# Patient Record
Sex: Male | Born: 1945 | Race: White | Hispanic: No | Marital: Married | Smoking: Current every day smoker
Health system: Southern US, Community
[De-identification: ages and names within clinical notes are randomized; demographics above are authoritative.]

## PROBLEM LIST (undated history)

## (undated) DIAGNOSIS — J449 Chronic obstructive pulmonary disease, unspecified: Secondary | ICD-10-CM

## (undated) DIAGNOSIS — F329 Major depressive disorder, single episode, unspecified: Secondary | ICD-10-CM

## (undated) DIAGNOSIS — F32A Depression, unspecified: Secondary | ICD-10-CM

## (undated) DIAGNOSIS — I10 Essential (primary) hypertension: Secondary | ICD-10-CM

## (undated) DIAGNOSIS — I4891 Unspecified atrial fibrillation: Secondary | ICD-10-CM

## (undated) DIAGNOSIS — F419 Anxiety disorder, unspecified: Secondary | ICD-10-CM

## (undated) HISTORY — PX: TONSILECTOMY/ADENOIDECTOMY WITH MYRINGOTOMY: SHX6125

---

## 2014-12-18 ENCOUNTER — Inpatient Hospital Stay (HOSPITAL_COMMUNITY)
Admission: AD | Admit: 2014-12-18 | Discharge: 2015-01-07 | DRG: 166 | Disposition: A | Discharge status: Hospice | Payer: Medicare Other | Source: Other Acute Inpatient Hospital | Attending: Cardiothoracic Surgery | Admitting: Cardiothoracic Surgery

## 2014-12-18 ENCOUNTER — Inpatient Hospital Stay (HOSPITAL_COMMUNITY): Payer: Medicare Other

## 2014-12-18 DIAGNOSIS — E43 Unspecified severe protein-calorie malnutrition: Secondary | ICD-10-CM | POA: Insufficient documentation

## 2014-12-18 DIAGNOSIS — F329 Major depressive disorder, single episode, unspecified: Secondary | ICD-10-CM | POA: Diagnosis present

## 2014-12-18 DIAGNOSIS — J9311 Primary spontaneous pneumothorax: Secondary | ICD-10-CM | POA: Diagnosis not present

## 2014-12-18 DIAGNOSIS — Z880 Allergy status to penicillin: Secondary | ICD-10-CM

## 2014-12-18 DIAGNOSIS — R319 Hematuria, unspecified: Secondary | ICD-10-CM | POA: Diagnosis present

## 2014-12-18 DIAGNOSIS — J860 Pyothorax with fistula: Secondary | ICD-10-CM | POA: Diagnosis not present

## 2014-12-18 DIAGNOSIS — T8182XA Emphysema (subcutaneous) resulting from a procedure, initial encounter: Secondary | ICD-10-CM | POA: Diagnosis present

## 2014-12-18 DIAGNOSIS — F17210 Nicotine dependence, cigarettes, uncomplicated: Secondary | ICD-10-CM | POA: Diagnosis present

## 2014-12-18 DIAGNOSIS — F419 Anxiety disorder, unspecified: Secondary | ICD-10-CM | POA: Diagnosis present

## 2014-12-18 DIAGNOSIS — J029 Acute pharyngitis, unspecified: Secondary | ICD-10-CM | POA: Diagnosis not present

## 2014-12-18 DIAGNOSIS — J439 Emphysema, unspecified: Secondary | ICD-10-CM

## 2014-12-18 DIAGNOSIS — I9581 Postprocedural hypotension: Secondary | ICD-10-CM | POA: Diagnosis not present

## 2014-12-18 DIAGNOSIS — N281 Cyst of kidney, acquired: Secondary | ICD-10-CM | POA: Diagnosis present

## 2014-12-18 DIAGNOSIS — J9622 Acute and chronic respiratory failure with hypercapnia: Secondary | ICD-10-CM | POA: Diagnosis not present

## 2014-12-18 DIAGNOSIS — J939 Pneumothorax, unspecified: Secondary | ICD-10-CM | POA: Diagnosis not present

## 2014-12-18 DIAGNOSIS — Y828 Other medical devices associated with adverse incidents: Secondary | ICD-10-CM | POA: Diagnosis present

## 2014-12-18 DIAGNOSIS — R0602 Shortness of breath: Secondary | ICD-10-CM | POA: Diagnosis present

## 2014-12-18 DIAGNOSIS — Z6823 Body mass index (BMI) 23.0-23.9, adult: Secondary | ICD-10-CM | POA: Diagnosis not present

## 2014-12-18 DIAGNOSIS — R739 Hyperglycemia, unspecified: Secondary | ICD-10-CM | POA: Diagnosis present

## 2014-12-18 DIAGNOSIS — J449 Chronic obstructive pulmonary disease, unspecified: Secondary | ICD-10-CM

## 2014-12-18 DIAGNOSIS — E46 Unspecified protein-calorie malnutrition: Secondary | ICD-10-CM

## 2014-12-18 DIAGNOSIS — I10 Essential (primary) hypertension: Secondary | ICD-10-CM | POA: Diagnosis present

## 2014-12-18 DIAGNOSIS — J441 Chronic obstructive pulmonary disease with (acute) exacerbation: Secondary | ICD-10-CM | POA: Diagnosis present

## 2014-12-18 DIAGNOSIS — I4891 Unspecified atrial fibrillation: Secondary | ICD-10-CM | POA: Diagnosis present

## 2014-12-18 DIAGNOSIS — I700 Atherosclerosis of aorta: Secondary | ICD-10-CM | POA: Diagnosis present

## 2014-12-18 DIAGNOSIS — E785 Hyperlipidemia, unspecified: Secondary | ICD-10-CM | POA: Diagnosis present

## 2014-12-18 DIAGNOSIS — Z7951 Long term (current) use of inhaled steroids: Secondary | ICD-10-CM

## 2014-12-18 DIAGNOSIS — Z9981 Dependence on supplemental oxygen: Secondary | ICD-10-CM

## 2014-12-18 DIAGNOSIS — J9383 Other pneumothorax: Secondary | ICD-10-CM | POA: Diagnosis present

## 2014-12-18 DIAGNOSIS — Z7982 Long term (current) use of aspirin: Secondary | ICD-10-CM

## 2014-12-18 DIAGNOSIS — J9621 Acute and chronic respiratory failure with hypoxia: Secondary | ICD-10-CM | POA: Diagnosis not present

## 2014-12-18 DIAGNOSIS — J9601 Acute respiratory failure with hypoxia: Secondary | ICD-10-CM

## 2014-12-18 DIAGNOSIS — I252 Old myocardial infarction: Secondary | ICD-10-CM

## 2014-12-18 DIAGNOSIS — Z419 Encounter for procedure for purposes other than remedying health state, unspecified: Secondary | ICD-10-CM

## 2014-12-18 DIAGNOSIS — J9382 Other air leak: Secondary | ICD-10-CM | POA: Diagnosis present

## 2014-12-18 DIAGNOSIS — Z9689 Presence of other specified functional implants: Secondary | ICD-10-CM

## 2014-12-18 DIAGNOSIS — Z01810 Encounter for preprocedural cardiovascular examination: Secondary | ICD-10-CM | POA: Diagnosis not present

## 2014-12-18 HISTORY — DX: Chronic obstructive pulmonary disease, unspecified: J44.9

## 2014-12-18 HISTORY — DX: Essential (primary) hypertension: I10

## 2014-12-18 HISTORY — DX: Unspecified atrial fibrillation: I48.91

## 2014-12-18 HISTORY — DX: Major depressive disorder, single episode, unspecified: F32.9

## 2014-12-18 HISTORY — DX: Anxiety disorder, unspecified: F41.9

## 2014-12-18 HISTORY — DX: Depression, unspecified: F32.A

## 2014-12-19 ENCOUNTER — Encounter (HOSPITAL_COMMUNITY): Payer: Self-pay | Admitting: Internal Medicine

## 2014-12-19 DIAGNOSIS — J939 Pneumothorax, unspecified: Secondary | ICD-10-CM

## 2014-12-19 LAB — COMPREHENSIVE METABOLIC PANEL
ALT: 17 U/L (ref 0–53)
ANION GAP: 3 — AB (ref 5–15)
AST: 21 U/L (ref 0–37)
Albumin: 3.2 g/dL — ABNORMAL LOW (ref 3.5–5.2)
Alkaline Phosphatase: 69 U/L (ref 39–117)
BUN: 15 mg/dL (ref 6–23)
CO2: 38 mmol/L — ABNORMAL HIGH (ref 19–32)
Calcium: 8.6 mg/dL (ref 8.4–10.5)
Chloride: 98 mmol/L (ref 96–112)
Creatinine, Ser: 0.56 mg/dL (ref 0.50–1.35)
GFR calc Af Amer: >90 mL/min (ref >90)
Glucose, Bld: 140 mg/dL — ABNORMAL HIGH (ref 70–99)
Potassium: 4.4 mmol/L (ref 3.5–5.1)
Sodium: 139 mmol/L (ref 135–145)
Total Bilirubin: 0.3 mg/dL (ref 0.3–1.2)
Total Protein: 6.3 g/dL (ref 6.0–8.3)

## 2014-12-19 LAB — MAGNESIUM: Magnesium: 2.1 mg/dL (ref 1.5–2.5)

## 2014-12-19 LAB — CBC WITH DIFFERENTIAL/PLATELET
Basophils Absolute: 0 10*3/uL (ref 0.0–0.1)
Basophils Relative: 0 % (ref 0–1)
Eosinophils Absolute: 0 10*3/uL (ref 0.0–0.7)
Eosinophils Relative: 0 % (ref 0–5)
HCT: 37.9 % — ABNORMAL LOW (ref 39.0–52.0)
Hemoglobin: 11.3 g/dL — ABNORMAL LOW (ref 13.0–17.0)
LYMPHS PCT: 7 % — AB (ref 12–46)
Lymphs Abs: 1 10*3/uL (ref 0.7–4.0)
MCH: 29.1 pg (ref 26.0–34.0)
MCHC: 29.8 g/dL — ABNORMAL LOW (ref 30.0–36.0)
MCV: 97.7 fL (ref 78.0–100.0)
Monocytes Absolute: 0.9 10*3/uL (ref 0.1–1.0)
Monocytes Relative: 7 % (ref 3–12)
Neutro Abs: 11.4 10*3/uL — ABNORMAL HIGH (ref 1.7–7.7)
Neutrophils Relative %: 86 % — ABNORMAL HIGH (ref 43–77)
PLATELETS: 157 10*3/uL (ref 150–400)
RBC: 3.88 MIL/uL — ABNORMAL LOW (ref 4.22–5.81)
RDW: 15.1 % (ref 11.5–15.5)
WBC: 13.2 10*3/uL — ABNORMAL HIGH (ref 4.0–10.5)

## 2014-12-19 LAB — GLUCOSE, CAPILLARY
GLUCOSE-CAPILLARY: 129 mg/dL — AB (ref 70–99)
Glucose-Capillary: 113 mg/dL — ABNORMAL HIGH (ref 70–99)
Glucose-Capillary: 104 mg/dL — ABNORMAL HIGH (ref 70–99)
Glucose-Capillary: 120 mg/dL — ABNORMAL HIGH (ref 70–99)
Glucose-Capillary: 138 mg/dL — ABNORMAL HIGH (ref 70–99)

## 2014-12-19 LAB — PHOSPHORUS: PHOSPHORUS: 2.8 mg/dL (ref 2.3–4.6)

## 2014-12-19 LAB — MRSA PCR SCREENING: MRSA BY PCR: NEGATIVE

## 2014-12-19 MED ORDER — IPRATROPIUM-ALBUTEROL 0.5-2.5 (3) MG/3ML IN SOLN
3.0000 mL | Freq: Four times a day (QID) | RESPIRATORY_TRACT | Status: DC | PRN
  Administered 2014-12-19 – 2014-12-31 (×8): 3 mL via RESPIRATORY_TRACT
  Filled 2014-12-19 (×8): qty 3

## 2014-12-19 MED ORDER — HEPARIN SODIUM (PORCINE) 5000 UNIT/ML IJ SOLN
5000.0000 [IU] | Freq: Three times a day (TID) | INTRAMUSCULAR | Status: DC
  Administered 2014-12-19 – 2014-12-30 (×34): 5000 [IU] via SUBCUTANEOUS
  Filled 2014-12-19 (×45): qty 1

## 2014-12-19 MED ORDER — INSULIN ASPART 100 UNIT/ML ~~LOC~~ SOLN
1.0000 [IU] | SUBCUTANEOUS | Status: DC
  Administered 2014-12-19 – 2014-12-21 (×5): 1 [IU] via SUBCUTANEOUS

## 2014-12-19 MED ORDER — BUSPIRONE HCL 10 MG PO TABS
10.0000 mg | ORAL_TABLET | Freq: Three times a day (TID) | ORAL | Status: DC
  Administered 2014-12-19 – 2015-01-07 (×57): 10 mg via ORAL
  Filled 2014-12-19 (×62): qty 1

## 2014-12-19 MED ORDER — ASPIRIN 81 MG PO CHEW
81.0000 mg | CHEWABLE_TABLET | Freq: Every day | ORAL | Status: DC
  Administered 2014-12-19 – 2015-01-07 (×19): 81 mg via ORAL
  Filled 2014-12-19 (×19): qty 1

## 2014-12-19 MED ORDER — SERTRALINE HCL 100 MG PO TABS
100.0000 mg | ORAL_TABLET | Freq: Every day | ORAL | Status: DC
  Administered 2014-12-19 – 2015-01-07 (×19): 100 mg via ORAL
  Filled 2014-12-19 (×20): qty 1

## 2014-12-19 MED ORDER — TIOTROPIUM BROMIDE MONOHYDRATE 18 MCG IN CAPS
18.0000 ug | ORAL_CAPSULE | Freq: Every day | RESPIRATORY_TRACT | Status: DC
  Administered 2014-12-19 – 2015-01-07 (×16): 18 ug via RESPIRATORY_TRACT
  Filled 2014-12-19 (×5): qty 5

## 2014-12-19 MED ORDER — NICOTINE 14 MG/24HR TD PT24
14.0000 mg | MEDICATED_PATCH | Freq: Every day | TRANSDERMAL | Status: DC
  Administered 2014-12-19: 14 mg via TRANSDERMAL
  Filled 2014-12-19 (×20): qty 1

## 2014-12-19 MED ORDER — ADULT MULTIVITAMIN W/MINERALS CH
1.0000 | ORAL_TABLET | Freq: Every day | ORAL | Status: DC
  Administered 2014-12-19 – 2015-01-07 (×19): 1 via ORAL
  Filled 2014-12-19 (×20): qty 1

## 2014-12-19 MED ORDER — MIRTAZAPINE 7.5 MG PO TABS
7.5000 mg | ORAL_TABLET | Freq: Every day | ORAL | Status: DC
  Administered 2014-12-19 – 2015-01-06 (×20): 7.5 mg via ORAL
  Filled 2014-12-19 (×24): qty 1

## 2014-12-19 MED ORDER — METOPROLOL TARTRATE 12.5 MG HALF TABLET
12.5000 mg | ORAL_TABLET | Freq: Two times a day (BID) | ORAL | Status: DC
  Administered 2014-12-19 – 2015-01-07 (×36): 12.5 mg via ORAL
  Filled 2014-12-19 (×41): qty 1

## 2014-12-19 MED ORDER — ACETAMINOPHEN 325 MG PO TABS
650.0000 mg | ORAL_TABLET | Freq: Four times a day (QID) | ORAL | Status: DC | PRN
  Administered 2014-12-19 – 2014-12-29 (×3): 650 mg via ORAL
  Filled 2014-12-19 (×3): qty 2

## 2014-12-19 MED ORDER — BUDESONIDE-FORMOTEROL FUMARATE 160-4.5 MCG/ACT IN AERO
2.0000 | INHALATION_SPRAY | Freq: Two times a day (BID) | RESPIRATORY_TRACT | Status: DC
  Administered 2014-12-19 – 2014-12-30 (×23): 2 via RESPIRATORY_TRACT
  Filled 2014-12-19 (×2): qty 6

## 2014-12-19 MED ORDER — SODIUM CHLORIDE 0.9 % IV SOLN: 250.0000 mL | INTRAVENOUS | Status: DC | PRN

## 2014-12-19 MED ORDER — PRAVASTATIN SODIUM 80 MG PO TABS
80.0000 mg | ORAL_TABLET | Freq: Every day | ORAL | Status: DC
Start: 2014-12-19 — End: 2015-01-07
  Administered 2014-12-19 – 2015-01-07 (×19): 80 mg via ORAL
  Filled 2014-12-19 (×20): qty 1

## 2014-12-19 NOTE — Progress Notes (Signed)
12/19/2014 1820 Received to room 2w02 a tx from 72M.  Monitor placed and CT called.  Oriented pt. To room, call light and bed.  Call bell in reach.  Chest tube collection system secure on floor.   Kathryne HitchAllen, Perel Hauschild C

## 2014-12-19 NOTE — Progress Notes (Signed)
PULMONARY / CRITICAL CARE MEDICINE HISTORY AND PHYSICAL EXAMINATION   Name: Roger Mathews MRN: [id number] DOB: 12-03-1945    ADMISSION DATE:  12/18/2014  PRIMARY SERVICE: PCCM  CHIEF COMPLAINT:  SOB  BRIEF PATIENT DESCRIPTION: 62 M with "end stage" COPD admitted to [facility] with SOB found to have a large pneumothorax. Chest tube placed and transferred to [facility] for further management.   >was on home hospice prior   SIGNIFICANT EVENTS / STUDIES:  Chest tube placed 2/20 ABGs 7.14/104/83 --> 7.18/92.2/178  LINES / TUBES: PIV  CULTURES: None  ANTIBIOTICS: Doxy 2/20 -  SUBJECTIVE:  No distress.   VITAL SIGNS: Temp:  [98.4 F (36.9 C)-98.5 F (36.9 C)] 98.4 F (36.9 C) (02/21 0440) Pulse Rate:  [82-116] 85 (02/21 0600) Resp:  [21-26] 21 (02/21 0600) BP: (82-118)/(63-93) 104/68 mmHg (02/21 0600) SpO2:  [92 %-97 %] 97 % (02/21 0600) Weight:  [73.5 kg (162 lb 0.6 oz)-75.4 kg (166 lb 3.6 oz)] 73.5 kg (162 lb 0.6 oz) (02/21 0600)    INTAKE / OUTPUT: Intake/Output      02/20 0701 - 02/21 0700 02/21 0701 - 02/22 0700   P.O. 720    Total Intake(mL/kg) 720 (9.8)    Urine (mL/kg/hr) 900    Total Output 900     Net -180            PHYSICAL EXAMINATION: General:  Elderly-appearing M in NAD Neuro:  Intact HEENT:  Sclera anicteric, conjunctiva pink, MMM, OP clear Neck: Trachea supple and midline, (-) LAN or JVD Cardiovascular:  RRR, NS1/S2, (-) MRG Lungs:  Mild wheezing on the left, decreased BS throughout the right. (+) subcutaneous air throughout the right hemithorax + intermittent 5/5 airleak  Abdomen:  S/NT/ND/(+)BS Musculoskeletal:  (-) C/C/E Skin:  Intact  LABS:  CBC  Recent Labs Lab 12/19/14 0302  WBC 13.2*  HGB 11.3*  HCT 37.9*  PLT 157   Coag's No results for input(s): APTT, INR in the last 168 hours. BMET  Recent Labs Lab 12/19/14 0302  NA 139  K 4.4  CL 98  CO2 38*  BUN 15  CREATININE 0.56  GLUCOSE 140*   Electrolytes  Recent  Labs Lab 12/19/14 0302  CALCIUM 8.6  MG 2.1  PHOS 2.8   Sepsis Markers No results for input(s): LATICACIDVEN, PROCALCITON, O2SATVEN in the last 168 hours. ABG No results for input(s): PHART, PCO2ART, PO2ART in the last 168 hours. Liver Enzymes  Recent Labs Lab 12/19/14 0302  AST 21  ALT 17  ALKPHOS 69  BILITOT 0.3  ALBUMIN 3.2*   Cardiac Enzymes No results for input(s): TROPONINI, PROBNP in the last 168 hours. Glucose  Recent Labs Lab 12/18/14 2347 12/19/14 0605  GLUCAP 129* 113*   OSH lab work reviewed. Glucose 291, WBC 15.1   Imaging Dg Chest Port 1 View  12/19/2014   CLINICAL DATA:  Right-sided chest tube  EXAM: PORTABLE CHEST - 1 VIEW  COMPARISON:  None.  FINDINGS: There is a right chest tube. There is extensive subcutaneous emphysema. The subcutaneous emphysema decreases sensitivity for detection of a pneumothorax. There appears to be a small apical pleural pneumothorax on the right. No consolidated airspace opacities are evident. No large effusions are evident.  IMPRESSION: Probable small apical pneumothorax on the right. Right chest tube. Subcutaneous emphysema.   Electronically Signed   By: Ellery Plunk M.D.   On: 12/19/2014 00:30    EKG: Non in chart CXR: Small right PTX with [place] emphysema  ASSESSMENT /  PLAN:  Active Problems:   Pneumothorax    Pneumothorax with [place] emphysema: Patient still with air leak. On arrival no occlusive dressing in place. COPD: P:   Maintain CT to suction, keep at current pressure Continue OP Meds PRN Duonebs Supplemental O2 to maintain sats >= 92% F/u CXR in am  Afib:  P:   Cont BB for now but overall poor choice for patient with "end stage COPD"  Hematuria: Did not have a Foley placed in hospital. P:   Consider urology consult for scope if continues    TODAY'S SUMMARY:  spont PTX. No distress. Still w/ intermittent airleak. Should be good for SDU transfer later this afternoon  Simonne MartinetPeter E Babcock  ACNP-BC [facility] Pager # [phone] OR # [phone] if no answer  12/19/2014, 7:22 AM  STAFF NOTE: I, Rory Percyaniel Aaditya Letizia, MD FACP have personally reviewed patient's available data, including medical history, events of note, physical examination and test results as part of my evaluation. I have discussed with resident/NP and other care providers such as pharmacist, RN and RRT. In addition, I personally evaluated patient and elicited key findings of: no distress, appears had a substantial PTX pre CT, leak noted, keep to suction, pcxr in am , move out of icu, BDer's, on diet  Mcarthur RossettiDaniel J. Tyson AliasFeinstein, MD, FACP Pgr: [phone] [place] [facility] 12/19/2014 8:55 AM

## 2014-12-19 NOTE — H&P (Signed)
PULMONARY / CRITICAL CARE MEDICINE HISTORY AND PHYSICAL EXAMINATION   Name: Roger Mathews MRN: [id number] DOB: [birth year]    ADMISSION DATE:  12/18/2014  PRIMARY SERVICE: PCCM  CHIEF COMPLAINT:  SOB  BRIEF PATIENT DESCRIPTION: 69 M with "end stage" COPD admitted to [facility] with SOB found to have a large pneumothorax. Chest tube placed and transferred to [facility] for further management.   SIGNIFICANT EVENTS / STUDIES:  Chest tube placed 2/20 ABGs 7.14/104/83 --> 7.18/92.2/178  LINES / TUBES: PIV  CULTURES: None  ANTIBIOTICS: Doxy 2/20 -  HISTORY OF PRESENT ILLNESS:  Roger Mathews is a 69 yo M with  "end stage" COPD who was on hospice until the morning of 2/20. The evening prior to presentation he noted worsening SOB; it became intolerable on the morning of presentation and he decided to go the local ED. At the local ED he was found to have a pneumothorax and a chest tube was placed. He remained hypercarbic and was placed on BiPAP; he was transferred to [facility] in anticipation of intubation. Prior to the transfer, he developed subcutaneous emphysema. Currently, he feels much better than when he arrived at [facility].  PAST MEDICAL HISTORY :  Past Medical History  Diagnosis Date  . COPD (chronic obstructive pulmonary disease)   . Anxiety   . Depression   . HTN (hypertension)    Past Surgical History  Procedure Laterality Date  . Tonsilectomy/adenoidectomy with myringotomy     Prior to Admission medications   Medication Sig Start Date End Date Taking? Authorizing Provider  albuterol (PROVENTIL) (2.5 MG/3ML) 0.083% nebulizer solution Take 2.5 mg by nebulization every 6 (six) hours as needed for wheezing or shortness of breath.   Yes Historical Provider, MD  aspirin 81 MG tablet Take 81 mg by mouth daily.   Yes Historical Provider, MD  budesonide-formoterol (SYMBICORT) 160-4.5 MCG/ACT inhaler Inhale 2 puffs into the lungs 2 (two) times daily.   Yes Historical Provider, MD   busPIRone (BUSPAR) 10 MG tablet Take 10 mg by mouth 3 (three) times daily.   Yes Historical Provider, MD  hyoscyamine (ANASPAZ) 0.125 MG TBDP disintergrating tablet Place 0.25 mg under the tongue every 4 (four) hours as needed (secretions).   Yes Historical Provider, MD  metoprolol tartrate (LOPRESSOR) 25 MG tablet Take 12.5 mg by mouth 2 (two) times daily.   Yes Historical Provider, MD  mirtazapine (REMERON) 7.5 MG tablet Take 7.5 mg by mouth at bedtime.   Yes Historical Provider, MD  Multiple Vitamins-Minerals (MULTIVITAMIN WITH MINERALS) tablet Take 1 tablet by mouth daily.   Yes Historical Provider, MD  nicotine (NICODERM CQ - DOSED IN MG/24 HOURS) 14 mg/24hr patch Place 14 mg onto the skin daily.   Yes Historical Provider, MD  ondansetron (ZOFRAN) 8 MG tablet Take by mouth every 8 (eight) hours as needed for nausea or vomiting.   Yes Historical Provider, MD  pravastatin (PRAVACHOL) 80 MG tablet Take 80 mg by mouth daily.   Yes Historical Provider, MD  sertraline (ZOLOFT) 100 MG tablet Take 100 mg by mouth daily.   Yes Historical Provider, MD  tiotropium (SPIRIVA) 18 MCG inhalation capsule Place 18 mcg into inhaler and inhale daily.   Yes Historical Provider, MD   Allergies  Allergen Reactions  . Penicillins     Other reaction(s): Unknown    FAMILY HISTORY:  No family history on file. SOCIAL HISTORY:  reports that he has been smoking Cigarettes.  He started smoking about 53 years ago. He has a  104 pack-year smoking history. He does not have any smokeless tobacco history on file. His alcohol and drug histories are not on file.  REVIEW OF SYSTEMS:  A 12-system ROS was conducted and, unless otherwise specified in the HPI, was negative.   SUBJECTIVE:   VITAL SIGNS: Temp:  [98.5 F (36.9 C)] 98.5 F (36.9 C) (02/20 2351) Pulse Rate:  [104-116] 104 (02/21 0100) Resp:  [23-25] 25 (02/21 0100) BP: (82-118)/(67-93) 82/67 mmHg (02/21 0100) SpO2:  [92 %-94 %] 94 % (02/21 0100) Weight:   [166 lb 3.6 oz (75.4 kg)] 166 lb 3.6 oz (75.4 kg) (02/21 0000) HEMODYNAMICS:   VENTILATOR SETTINGS:   INTAKE / OUTPUT: Intake/Output      02/20 0701 - 02/21 0700   Urine (mL/kg/hr) 150   Total Output 150   Net -150         PHYSICAL EXAMINATION: General:  Elderly-appearing M in NAD Neuro:  Intact HEENT:  Sclera anicteric, conjunctiva pink, MMM, OP clear Neck: Trachea supple and midline, (-) LAN or JVD Cardiovascular:  RRR, NS1/S2, (-) MRG Lungs:  Mild wheezing on the left, decreased BS throughout the right. (+) subcutaneous air throughout the right hemithorax Abdomen:  S/NT/ND/(+)BS Musculoskeletal:  (-) C/C/E Skin:  Intact  LABS:  CBC No results for input(s): WBC, HGB, HCT, PLT in the last 168 hours. Coag's No results for input(s): APTT, INR in the last 168 hours. BMET No results for input(s): NA, K, CL, CO2, BUN, CREATININE, GLUCOSE in the last 168 hours. Electrolytes No results for input(s): CALCIUM, MG, PHOS in the last 168 hours. Sepsis Markers No results for input(s): LATICACIDVEN, PROCALCITON, O2SATVEN in the last 168 hours. ABG No results for input(s): PHART, PCO2ART, PO2ART in the last 168 hours. Liver Enzymes No results for input(s): AST, ALT, ALKPHOS, BILITOT, ALBUMIN in the last 168 hours. Cardiac Enzymes No results for input(s): TROPONINI, PROBNP in the last 168 hours. Glucose  Recent Labs Lab 12/18/14 2347  GLUCAP 129*   OSH lab work reviewed. Glucose 291, WBC 15.1   Imaging Dg Chest Port 1 View  12/19/2014   CLINICAL DATA:  Right-sided chest tube  EXAM: PORTABLE CHEST - 1 VIEW  COMPARISON:  None.  FINDINGS: There is a right chest tube. There is extensive subcutaneous emphysema. The subcutaneous emphysema decreases sensitivity for detection of a pneumothorax. There appears to be a small apical pleural pneumothorax on the right. No consolidated airspace opacities are evident. No large effusions are evident.  IMPRESSION: Probable small apical  pneumothorax on the right. Right chest tube. Subcutaneous emphysema.   Electronically Signed   By: Ellery Plunkaniel R Mitchell M.D.   On: 12/19/2014 00:30    EKG: Non in chart CXR: Small right PTX with [place] emphysema  ASSESSMENT / PLAN:  Active Problems:   * No active hospital problems. *   PULMONARY A: Pneumothorax with [place] emphysema: Patient still with air leak. On arrival no occlusive dressing in place. COPD: P:   Maintain CT to suction overnight Continue OP Meds PRN Duonebs Supplemental O2 to maintain sats >= 92%  CARDIOVASCULAR A: Afib:  P:   Cont BB for now but overall poor choice for patient with "end stage COPD"  RENAL A: Hematuria: Did not have a Foley placed in hospital. P:   Consider urology consult for scope  GASTROINTESTINAL A: No acute issues  HEMATOLOGIC A: No acute issues  INFECTIOUS A: No Acute Issues  ENDOCRINE A: Elevated CBG:  P:   A1c SSI  NEUROLOGIC A: No acute  issues P: Continue OP regimen  BEST PRACTICE / DISPOSITION Level of Care:  ICU overnight, likely to floor in AM Primary Service:  PCCM Consultants:  None Code Status:  Full Diet:  Regular DVT Px:  SQH GI Px:  Not indicated Skin Integrity:  Intact Social / Family:  Not updated  TODAY'S SUMMARY:   I have personally obtained a history, examined the patient, evaluated laboratory and imaging results, formulated the assessment and plan and placed orders.  CRITICAL CARE: The patient is critically ill with multiple organ systems failure and requires high complexity decision making for assessment and support, frequent evaluation and titration of therapies, application of advanced monitoring technologies and extensive interpretation of multiple databases. Critical Care Time devoted to patient care services described in this note is 35 minutes.   Evalyn Casco, MD Pulmonary and Critical Care Medicine [facility] Pager: [phone]   12/19/2014, 1:14 AM

## 2014-12-19 NOTE — Progress Notes (Signed)
Spoke with Dr Katrinka Blazingsmith with Critical care/elink and orders received to exchage chest tube drainage system to the [place] chest drain and to place chest tube at -20 cm suction. Orders were entered and completed. patient tolerated well.Johnedward Brodrick, Randall AnKristin Jessup RN

## 2014-12-19 NOTE — Progress Notes (Signed)
Documentation for chest tube/system type missing from previous hospital placement. RN assessment of chest tube insertion site was without significant findings, no draining or leaking from the insertion site, was unable to view site as it is covered in tape.  Current drainage system was to water seal drainage.  Roger Mathews

## 2014-12-20 ENCOUNTER — Inpatient Hospital Stay (HOSPITAL_COMMUNITY): Payer: Medicare Other

## 2014-12-20 DIAGNOSIS — J939 Pneumothorax, unspecified: Secondary | ICD-10-CM

## 2014-12-20 LAB — COMPREHENSIVE METABOLIC PANEL
ALT: 18 U/L (ref 0–53)
ANION GAP: 4 — AB (ref 5–15)
AST: 19 U/L (ref 0–37)
Albumin: 3.3 g/dL — ABNORMAL LOW (ref 3.5–5.2)
Alkaline Phosphatase: 71 U/L (ref 39–117)
BUN: 15 mg/dL (ref 6–23)
CHLORIDE: 97 mmol/L (ref 96–112)
CO2: 43 mmol/L — AB (ref 19–32)
Calcium: 8.9 mg/dL (ref 8.4–10.5)
Creatinine, Ser: 0.53 mg/dL (ref 0.50–1.35)
GLUCOSE: 102 mg/dL — AB (ref 70–99)
POTASSIUM: 4.3 mmol/L (ref 3.5–5.1)
Sodium: 144 mmol/L (ref 135–145)
Total Bilirubin: 0.3 mg/dL (ref 0.3–1.2)
Total Protein: 6.5 g/dL (ref 6.0–8.3)

## 2014-12-20 LAB — GLUCOSE, CAPILLARY
GLUCOSE-CAPILLARY: 140 mg/dL — AB (ref 70–99)
GLUCOSE-CAPILLARY: 99 mg/dL (ref 70–99)
Glucose-Capillary: 106 mg/dL — ABNORMAL HIGH (ref 70–99)
Glucose-Capillary: 109 mg/dL — ABNORMAL HIGH (ref 70–99)
Glucose-Capillary: 124 mg/dL — ABNORMAL HIGH (ref 70–99)
Glucose-Capillary: 59 mg/dL — ABNORMAL LOW (ref 70–99)
Glucose-Capillary: 98 mg/dL (ref 70–99)

## 2014-12-20 LAB — CBC
HCT: 41.1 % (ref 39.0–52.0)
HEMOGLOBIN: 12.2 g/dL — AB (ref 13.0–17.0)
MCH: 29.1 pg (ref 26.0–34.0)
MCHC: 29.7 g/dL — ABNORMAL LOW (ref 30.0–36.0)
MCV: 98.1 fL (ref 78.0–100.0)
PLATELETS: 159 10*3/uL (ref 150–400)
RBC: 4.19 MIL/uL — ABNORMAL LOW (ref 4.22–5.81)
RDW: 15.1 % (ref 11.5–15.5)
WBC: 9.9 10*3/uL (ref 4.0–10.5)

## 2014-12-20 LAB — HEMOGLOBIN A1C
Hgb A1c MFr Bld: 5.8 % — ABNORMAL HIGH (ref 4.8–5.6)
MEAN PLASMA GLUCOSE: 120 mg/dL

## 2014-12-20 NOTE — Progress Notes (Signed)
Lab result called:  CO2 = 43  Internal Medicine team notified.

## 2014-12-20 NOTE — Plan of Care (Signed)
Problem: Phase I Progression Outcomes Goal: O2 sats > or equal 90% or at baseline Outcome: Completed/Met Date Met:  12/20/14 Patient is currently on 3L which is his baseline at home

## 2014-12-20 NOTE — Progress Notes (Signed)
Inpatient Diabetes Program Recommendations  AACE/ADA: New Consensus Statement on Inpatient Glycemic Control (2013)  Target Ranges:  Prepandial:   less than 140 mg/dL      Peak postprandial:   less than 180 mg/dL (1-2 hours)      Critically ill patients:  140 - 180 mg/dL    Pt on ICU protocol correction scale-receiving very little correction. May want to order sensitive tidwc if needed.  Thank you Roger CoffinAnn Oluwakemi Salsberry, RN, MSN, CDE  Diabetes Inpatient Program Office: [phone] Pager: [phone]

## 2014-12-20 NOTE — Progress Notes (Signed)
CBG 138 AT BEDTIME. 1 UNIT OF NOVOLOG GIVEN PER SLIDING SCALE.  CBG AT THIS TIME 59. PATIENT ASYMPTOMATIC.  PATIENT GIVEN ICE CREAM, ORANGE JUICE, AND GRAHAM CRACKERS.  WILL RE-ASSESS.

## 2014-12-20 NOTE — Progress Notes (Signed)
PULMONARY / CRITICAL CARE MEDICINE    Name: Roger Mathews MRN: [id number] DOB: April 02, 1946    ADMISSION DATE:  12/18/2014  PRIMARY SERVICE: PCCM  CHIEF COMPLAINT:  SOB  BRIEF PATIENT DESCRIPTION: [age] M with "end stage" COPD [facility] with SOB found to have a large pneumothorax. Chest tube placed and [facility].   >was on home hospice prior   SIGNIFICANT EVENTS / STUDIES:  Chest tube placed 2/20 ABGs 7.14/104/83 --> 7.18/92.2/178   SUBJECTIVE:  No distress.   VITAL SIGNS: Temp:  [98.1 F (36.7 C)-98.7 F (37.1 C)] 98.7 F (37.1 C) (02/22 0443) Pulse Rate:  [79-119] 114 (02/22 0443) Resp:  [15-22] 22 (02/22 0443) BP: (100-120)/(59-102) 116/97 mmHg (02/22 0443) SpO2:  [91 %-99 %] 95 % (02/22 0443) Weight:  [160 lb 0.9 oz (72.6 kg)] 160 lb 0.9 oz (72.6 kg) (02/22 0443)    INTAKE / OUTPUT: Intake/Output      02/21 0701 - 02/22 0700 02/22 0701 - 02/23 0700   P.O. 420    Total Intake(mL/kg) 420 (5.8)    Urine (mL/kg/hr) 1625 (0.9)    Chest Tube 0 (0) 0 (0)   Total Output 1625 0   Net -1205 0          PHYSICAL EXAMINATION: General:  Elderly-appearing M in NAD Neuro:  Intact, MAE, appropriate  HEENT:  Sclera anicteric, conjunctiva pink, MMM, OP clear, trachea midline Cardiovascular:  RRR, NS1/S2, (-) MRG Lungs:  Diminished throughout, +subcutaneous air up to neck R>L, improved per pt. R chest tube with intermittent air leak.  No audible wheeze.   Abdomen:  S/NT/ND/(+)BS Musculoskeletal: warm and dry, no edema   LABS:  CBC  Recent Labs Lab 12/19/14 0302 12/20/14 0400  WBC 13.2* 9.9  HGB 11.3* 12.2*  HCT 37.9* 41.1  PLT 157 159   BMET  Recent Labs Lab 12/19/14 0302 12/20/14 0400  NA 139 144  K 4.4 4.3  CL 98 97  CO2 38* 43*  BUN 15 15  CREATININE 0.56 0.53  GLUCOSE 140* 102*   Electrolytes  Recent Labs Lab 12/19/14 0302 12/20/14 0400  CALCIUM 8.6 8.9  MG 2.1  --   PHOS 2.8  --       Imaging Dg Chest Port 1 View  12/20/2014   CLINICAL DATA:  Pneumothorax.  Evaluate chest tube.  EXAM: PORTABLE CHEST - 1 VIEW  COMPARISON:  12/19/2014  FINDINGS: There is now extensive bilateral subcutaneous emphysema. There is a right chest tube along the lateral aspect of the right hemithorax. There appears to be a lateral and basilar right pneumothorax. The pneumothorax may be slightly larger from the previous examination. Again noted is hyperexpansion of the right hemithorax compared to the left side. 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 Heart size is normal. Trachea remains midline.  IMPRESSION: Concern for an enlarging right pneumothorax despite stable position of the right chest tube.  Increased subcutaneous gas.  These results were called by telephone at the time of interpretation on 12/20/2014 at 8:13 am to the patient's nurse, Bradley FerrisBrandie Davis, who verbally acknowledged these results.   Electronically Signed   By: Richarda OverlieAdam  Henn M.D.   On: 12/20/2014 08:16   Dg Chest Port 1 View  12/19/2014   CLINICAL DATA:  Right-sided chest tube  EXAM: PORTABLE CHEST - 1 VIEW  COMPARISON:  None.  FINDINGS: There is a right chest tube. There is extensive subcutaneous emphysema. The subcutaneous emphysema decreases sensitivity for detection of  a pneumothorax. There appears to be a small apical pleural pneumothorax on the right. No consolidated airspace opacities are evident. No large effusions are evident.  IMPRESSION: Probable small apical pneumothorax on the right. Right chest tube. Subcutaneous emphysema.   Electronically Signed   By: Ellery Plunk M.D.   On: 12/19/2014 00:30    ASSESSMENT / PLAN:   Pneumothorax with [place] emphysema: Patient still with air leak. On arrival no occlusive dressing in place.  ?Increase ptx 2/22 COPD P:   Continue chest tube to suction, increase suction to 30cm 2/22 Reinforce occlusive dressing  Smoking cessation  Supplemental O2 as needed to maintain sats  >92% Continue symbicort, spiriva PRN Duonebs F/u CXR pm 2/22 and in am  If ptx continues to worsen may need to replace CT  Afib.  Rate controlled.  ?new onset.  Recent admit reviewed in Care everywhere for SVT, but no mention AFib.  Not chronically anticoagulated.  P:   Cont BB for now but overall poor choice for patient with "end stage COPD" Hold anticoagulation for now   Hematuria: Did not have a Foley placed in hospital. P:   Consider urology consult for scope if continues  Check u/a 2/22  Depression/ anxiety:  P:  Cont home remeron, zoloft    Dirk Dress, NP 12/20/2014  8:42 AM Pager: [phone] or [phone]   Attending Note:  I have examined patient, reviewed labs, studies and notes. I have discussed the case with Jasper Riling, and I agree with the data and plans as amended above  Levy Pupa, MD, PhD 12/20/2014, 2:26 PM [place] [facility] [phone] or if no answer [phone]

## 2014-12-21 ENCOUNTER — Inpatient Hospital Stay (HOSPITAL_COMMUNITY): Payer: Medicare Other

## 2014-12-21 LAB — URINALYSIS, ROUTINE W REFLEX MICROSCOPIC
Bilirubin Urine: NEGATIVE
Glucose, UA: NEGATIVE mg/dL
Hgb urine dipstick: NEGATIVE
Ketones, ur: NEGATIVE mg/dL
Leukocytes, UA: NEGATIVE
NITRITE: NEGATIVE
Protein, ur: NEGATIVE mg/dL
SPECIFIC GRAVITY, URINE: 1.007 (ref 1.005–1.030)
Urobilinogen, UA: 0.2 mg/dL (ref 0.0–1.0)
pH: 8 (ref 5.0–8.0)

## 2014-12-21 LAB — GLUCOSE, CAPILLARY
GLUCOSE-CAPILLARY: 104 mg/dL — AB (ref 70–99)
GLUCOSE-CAPILLARY: 144 mg/dL — AB (ref 70–99)
Glucose-Capillary: 105 mg/dL — ABNORMAL HIGH (ref 70–99)
Glucose-Capillary: 106 mg/dL — ABNORMAL HIGH (ref 70–99)
Glucose-Capillary: 95 mg/dL (ref 70–99)

## 2014-12-21 LAB — BASIC METABOLIC PANEL
ANION GAP: 4 — AB (ref 5–15)
BUN: 10 mg/dL (ref 6–23)
CALCIUM: 9 mg/dL (ref 8.4–10.5)
CHLORIDE: 94 mmol/L — AB (ref 96–112)
CO2: 42 mmol/L (ref 19–32)
CREATININE: 0.44 mg/dL — AB (ref 0.50–1.35)
GFR calc Af Amer: >90 mL/min (ref >90)
GFR calc non Af Amer: >90 mL/min (ref >90)
Glucose, Bld: 101 mg/dL — ABNORMAL HIGH (ref 70–99)
Potassium: 3.8 mmol/L (ref 3.5–5.1)
Sodium: 140 mmol/L (ref 135–145)

## 2014-12-21 LAB — CBC
HEMATOCRIT: 41.6 % (ref 39.0–52.0)
Hemoglobin: 12.5 g/dL — ABNORMAL LOW (ref 13.0–17.0)
MCH: 29.2 pg (ref 26.0–34.0)
MCHC: 30 g/dL (ref 30.0–36.0)
MCV: 97.2 fL (ref 78.0–100.0)
Platelets: 151 10*3/uL (ref 150–400)
RBC: 4.28 MIL/uL (ref 4.22–5.81)
RDW: 14.9 % (ref 11.5–15.5)
WBC: 7.3 10*3/uL (ref 4.0–10.5)

## 2014-12-21 MED FILL — Albuterol Sulfate Soln Nebu 0.083% (2.5 MG/3ML): RESPIRATORY_TRACT | Qty: 3 | Status: AC

## 2014-12-21 NOTE — Progress Notes (Signed)
Lab called concerning critical lab value of co2 at 42.  Value is consistent with previously reported lab values.  Will leave a note for am doctor.

## 2014-12-21 NOTE — Progress Notes (Signed)
PULMONARY / CRITICAL CARE MEDICINE    Name: Roger Mathews MRN: [id number] DOB: Jul 25, 1946    ADMISSION DATE:  12/18/2014  PRIMARY SERVICE: PCCM  CHIEF COMPLAINT:  SOB  BRIEF PATIENT DESCRIPTION: [age] M with "end stage" COPD admitted to [facility] with SOB found to have a large pneumothorax. Chest tube placed and [facility].   >was on home hospice prior   SIGNIFICANT EVENTS / STUDIES:  Chest tube placed 2/20 ABGs 7.14/104/83 --> 7.18/92.2/178 2/22 > increase in PTX size, CT suction increased. 2/23 > PTX resolved on CXR, small air leak.     SUBJECTIVE:  Breathing better today, no complaints.   VITAL SIGNS: Temp:  [98 F (36.7 C)-98.1 F (36.7 C)] 98 F (36.7 C) (02/23 0402) Pulse Rate:  [103-112] 103 (02/23 0402) Resp:  [18-20] 18 (02/23 0402) BP: (98-120)/(62-76) 120/76 mmHg (02/23 0402) SpO2:  [95 %-97 %] 95 % (02/23 0803) Weight:  [71.9 kg (158 lb 8.2 oz)] 71.9 kg (158 lb 8.2 oz) (02/23 0402) 3 liters    INTAKE / OUTPUT: Intake/Output      02/22 0701 - 02/23 0700 02/23 0701 - 02/24 0700   P.O. 792 420   Total Intake(mL/kg) 792 (11) 420 (5.8)   Urine (mL/kg/hr) 1150 (0.7) 200 (0.9)   Stool 0 (0)    Chest Tube 0 (0)    Total Output 1150 200   Net -358 +220        Stool Occurrence 1 x      PHYSICAL EXAMINATION: General:  Elderly-appearing M in NAD Neuro:  Intact, MAE, appropriate  HEENT:  Sclera anicteric, conjunctiva pink, MMM, OP clear, trachea midline Cardiovascular:  RRR, NS1/S2, (-) MRG Lungs:  Diminished throughout, +subcutaneous air up to neck R>L. R chest tube with small intermittent air leak.  No audible wheeze.   Abdomen:  S/NT/ND/(+)BS Musculoskeletal: warm and dry, no edema   LABS:  CBC  Recent Labs Lab 12/19/14 0302 12/20/14 0400 12/21/14 0438  WBC 13.2* 9.9 7.3  HGB 11.3* 12.2* 12.5*  HCT 37.9* 41.1 41.6  PLT 157 159 151   BMET  Recent Labs Lab 12/19/14 0302 12/20/14 0400 12/21/14 0438  NA  139 144 140  K 4.4 4.3 3.8  CL 98 97 94*  CO2 38* 43* 42*  BUN 15 15 10   CREATININE 0.56 0.53 0.44*  GLUCOSE 140* 102* 101*   Electrolytes  Recent Labs Lab 12/19/14 0302 12/20/14 0400 12/21/14 0438  CALCIUM 8.6 8.9 9.0  MG 2.1  --   --   PHOS 2.8  --   --      Imaging Dg Chest Port 1 View  12/21/2014   CLINICAL DATA:  Recent pneumothorax  EXAM: PORTABLE CHEST - 1 VIEW  COMPARISON:  December 20, 2014  FINDINGS: Chest tube remains on the right. The previously noted lateral pneumothorax is not appreciable on this study, possibly in part due to overlapping subcutaneous emphysema. Extensive subcutaneous emphysema does remain.  There is no parenchymal edema or consolidation. Heart is upper normal in size with pulmonary vascularity within normal limits. No adenopathy.  IMPRESSION: The previously noted lateral pneumothorax on the right is no longer seen. It must be cautioned that subcutaneous air over this area could obscure a small pneumothorax. There is no evidence suggesting tension component. Chest tube position on the right is unchanged. Extensive subcutaneous emphysema diffusely is again noted. No lung edema or consolidation.   Electronically Signed   By: Bretta BangWilliam  Woodruff III M.D.  On: 12/21/2014 08:33   Dg Chest Port 1 View  12/20/2014   CLINICAL DATA:  Pneumothorax  EXAM: PORTABLE CHEST - 1 VIEW  COMPARISON:  08/20/2015  FINDINGS: Right chest tube remains in place with small lateral right pneumothorax, similar to prior study. Diffuse subcutaneous emphysema noted bilaterally, unchanged. Mild hyperinflation of the lungs. Heart is normal size. Patchy bibasilar opacities presumably atelectasis.  IMPRESSION: Stable small right lateral pneumothorax with right chest tube in place. Diffuse subcutaneous emphysema unchanged.  Patchy bibasilar opacities, likely atelectasis.   Electronically Signed   By: Charlett Nose M.D.   On: 12/20/2014 12:40   Dg Chest Port 1 View  12/20/2014   CLINICAL DATA:   Pneumothorax.  Evaluate chest tube.  EXAM: PORTABLE CHEST - 1 VIEW  COMPARISON:  12/19/2014  FINDINGS: There is now extensive bilateral subcutaneous emphysema. There is a right chest tube along the lateral aspect of the right hemithorax. There appears to be a lateral and basilar right pneumothorax. The pneumothorax may be slightly larger from the previous examination. Again noted is hyperexpansion of the right hemithorax compared to the left side. 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 Heart size is normal. Trachea remains midline.  IMPRESSION: Concern for an enlarging right pneumothorax despite stable position of the right chest tube.  Increased subcutaneous gas.  These results were called by telephone at the time of interpretation on 12/20/2014 at 8:13 am to the patient's nurse, Bradley Ferris, who verbally acknowledged these results.   Electronically Signed   By: Richarda Overlie M.D.   On: 12/20/2014 08:16    ASSESSMENT / PLAN:   Spontaneous Pneumothorax with [place] emphysema:  COPD  >>>  PTX resolved on 2/23 film. Small intermittent air leak remains.  Plan:   Continue chest tube to suction, decrease to 20 cmH2O Smoking cessation  Supplemental O2 as needed to maintain sats >92% Continue symbicort, spiriva PRN Duonebs  Afib.  Rate controlled.  ?new onset.  Recent admit reviewed in Care everywhere for SVT, but no mention AFib.  Not chronically anticoagulated.  Plan:   Cont BB for now but overall poor choice for patient with "end stage COPD" Hold anticoagulation for now   Hematuria: Did not have a Foley placed in hospital, no hematuria on 2/22 UA.  Plan:   Consider urology consult for scope if continues   Depression/ anxiety:  Plan:  Cont home remeron, zoloft   Hyperglycemia: (Resolved) Plan: DC sliding scale coverage Check glucose on chemistry   Joneen Roach, AGACNP-BC [facility] Pulmon[facility]/Critical Care Pager [phone] or [phone]   Reviewed above, examined.    He still  has small air leak, but CXR looks improved.  He denies chest pain.  Has decreased bs, but present b/l.  He has crepitus in upper body.  Will decrease chest tube suction, and f/u CXR.  Might need TCTS to evaluate if no further improvement.  Coralyn Helling, MD [facility] Pulmonary/Critical Care 12/21/2014, 2:31 PM Pager:  [phone] After 3pm call: [phone]

## 2014-12-22 ENCOUNTER — Inpatient Hospital Stay (HOSPITAL_COMMUNITY): Payer: Medicare Other

## 2014-12-22 DIAGNOSIS — J439 Emphysema, unspecified: Secondary | ICD-10-CM

## 2014-12-22 NOTE — Progress Notes (Signed)
Utilization review completed.  

## 2014-12-22 NOTE — Progress Notes (Signed)
PULMONARY / CRITICAL CARE MEDICINE    Name: Roger Mathews MRN: [id number] DOB: 07/21/1946    ADMISSION DATE:  12/18/2014  PRIMARY SERVICE: PCCM  CHIEF COMPLAINT:  SOB  BRIEF PATIENT DESCRIPTION: [age] M with "end stage" COPD admitted to [facility] with SOB found to have a large pneumothorax. Chest tube [facility].   >was on home hospice prior   SIGNIFICANT EVENTS / STUDIES:  Chest tube placed 2/20 ABGs 7.14/104/83 --> 7.18/92.2/178 2/22 > increase in PTX size, CT suction increased. 2/23 > PTX resolved on CXR, small air leak.     SUBJECTIVE:  Breathing better today, no complaints.   VITAL SIGNS: Temp:  [97.9 F (36.6 C)-98.6 F (37 C)] 97.9 F (36.6 C) (02/24 0500) Pulse Rate:  [89-95] 95 (02/24 0500) Resp:  [18-19] 19 (02/24 0500) BP: (107-116)/(78-85) 116/78 mmHg (02/24 0500) SpO2:  [97 %-99 %] 98 % (02/24 0844) Weight:  [156 lb 15.5 oz (71.2 kg)] 156 lb 15.5 oz (71.2 kg) (02/24 0500) 3 liters    INTAKE / OUTPUT: Intake/Output      02/23 0701 - 02/24 0700 02/24 0701 - 02/25 0700   P.O. 640    Total Intake(mL/kg) 640 (9)    Urine (mL/kg/hr) 1175 (0.7)    Stool     Chest Tube     Total Output 1175     Net -535            PHYSICAL EXAMINATION: General:  Elderly-appearing M in NAD Neuro:  Intact, MAE, appropriate  HEENT:  Sclera anicteric, conjunctiva pink, MMM, OP clear, trachea midline Cardiovascular:  RRR, NS1/S2, (-) MRG Lungs:  Diminished throughout, faint exp wheeze, +creptitus upper chest. R chest tube with continued small intermittent air leak.   Abdomen:  S/NT/ND/(+)BS Musculoskeletal: warm and dry, no edema   LABS:  CBC  Recent Labs Lab 12/19/14 0302 12/20/14 0400 12/21/14 0438  WBC 13.2* 9.9 7.3  HGB 11.3* 12.2* 12.5*  HCT 37.9* 41.1 41.6  PLT 157 159 151   BMET  Recent Labs Lab 12/19/14 0302 12/20/14 0400 12/21/14 0438  NA 139 144 140  K 4.4 4.3 3.8  CL 98 97 94*  CO2 38* 43* 42*   BUN 15 15 10   CREATININE 0.56 0.53 0.44*  GLUCOSE 140* 102* 101*   Electrolytes  Recent Labs Lab 12/19/14 0302 12/20/14 0400 12/21/14 0438  CALCIUM 8.6 8.9 9.0  MG 2.1  --   --   PHOS 2.8  --   --      Imaging Dg Chest Port 1 View  12/22/2014   CLINICAL DATA:  Right-sided pneumothorax, COPD.  EXAM: PORTABLE CHEST - 1 VIEW  COMPARISON:  Portable chest x-ray of December 21, 2014  FINDINGS: The lungs remain hyperinflated. A definite right-sided pneumothorax is not observed today. There is no alveolar infiltrate. The interstitial markings are coarse at both lung bases but the hemidiaphragms are sharp. The right-sided chest tube is unchanged in position overlying the lateral aspect of the seventh rib. A large amount of subcutaneous emphysema persists. There is no shift of the mediastinum. The heart and pulmonary vascularity are normal.  IMPRESSION: 1. Persistent extensive subcutaneous emphysema without obvious pneumothorax. Given the persistent subcutaneous emphysema, noncontrast CT scanning of the chest would be useful to exclude occult pneumothorax. 2. COPD with mild basilar fibrotic change.   Electronically Signed   By: David  [place]azil[place]Jordan   On: 12/22/2014 07:41   Dg Chest Port 1 View  12/21/2014  CLINICAL DATA:  Recent pneumothorax  EXAM: PORTABLE CHEST - 1 VIEW  COMPARISON:  December 20, 2014  FINDINGS: Chest tube remains on the right. The previously noted lateral pneumothorax is not appreciable on this study, possibly in part due to overlapping subcutaneous emphysema. Extensive subcutaneous emphysema does remain.  There is no parenchymal edema or consolidation. Heart is upper normal in size with pulmonary vascularity within normal limits. No adenopathy.  IMPRESSION: The previously noted lateral pneumothorax on the right is no longer seen. It must be cautioned that subcutaneous air over this area could obscure a small pneumothorax. There is no evidence suggesting tension component. Chest tube  position on the right is unchanged. Extensive subcutaneous emphysema diffusely is again noted. No lung edema or consolidation.   Electronically Signed   By: Bretta Bang III M.D.   On: 12/21/2014 08:33   Dg Chest Port 1 View  12/20/2014   CLINICAL DATA:  Pneumothorax  EXAM: PORTABLE CHEST - 1 VIEW  COMPARISON:  08/20/2015  FINDINGS: Right chest tube remains in place with small lateral right pneumothorax, similar to prior study. Diffuse subcutaneous emphysema noted bilaterally, unchanged. Mild hyperinflation of the lungs. Heart is normal size. Patchy bibasilar opacities presumably atelectasis.  IMPRESSION: Stable small right lateral pneumothorax with right chest tube in place. Diffuse subcutaneous emphysema unchanged.  Patchy bibasilar opacities, likely atelectasis.   Electronically Signed   By: Charlett Nose M.D.   On: 12/20/2014 12:40    ASSESSMENT / PLAN:   Spontaneous Pneumothorax with [place] emphysema:  COPD  >>>  PTX resolved on 2/23 film. Persistent sub-q air.  Small intermittent air leak remains.  Plan:   Continue chest tube to suction= 20 cmH2O Smoking cessation  Supplemental O2 as needed to maintain sats >92% Continue symbicort, spiriva PRN Duonebs ?cvts input given persistent air leak, ongoing sub-q air   Afib.  Rate controlled.  ?new onset.  Recent admit reviewed in Care everywhere for SVT, but no mention AFib.  Not chronically anticoagulated.  Plan:   Cont BB for now but overall poor choice for patient with "end stage COPD" Hold anticoagulation for now   Hematuria: Did not have a Foley placed in hospital, no hematuria on 2/22 UA.  Plan:   Consider urology consult for scope if continues   Depression/ anxiety:  Plan:  Cont home remeron, zoloft   Hyperglycemia: (Resolved) Plan: Monitor glucose on chemistry    Dirk Dress, NP 12/22/2014  9:35 AM Pager: [phone] or [phone]  Reviewed above, and examined.  Still has air leak and subcutaneous  emphysema.  Breathing stable, and denies chest pain.  Crepitus in upper body no change.  Will change chest tube to -10 suction and gradually decrease as tolerated >> once chest tube to water seal would then consider CT chest and decide if TCTS evaluation is needed.  Coralyn Helling, MD [facility] Pulmonary/Critical Care 12/22/2014, 10:13 AM Pager:  [phone] After 3pm call: [phone]

## 2014-12-23 ENCOUNTER — Inpatient Hospital Stay (HOSPITAL_COMMUNITY): Payer: Medicare Other

## 2014-12-23 MED ORDER — MENTHOL 3 MG MT LOZG
1.0000 | LOZENGE | OROMUCOSAL | Status: DC | PRN
  Administered 2014-12-23: 3 mg via ORAL
  Filled 2014-12-23 (×2): qty 9

## 2014-12-23 MED ORDER — PANTOPRAZOLE SODIUM 40 MG PO TBEC
40.0000 mg | DELAYED_RELEASE_TABLET | Freq: Every day | ORAL | Status: DC
  Administered 2014-12-24 – 2014-12-30 (×7): 40 mg via ORAL
  Filled 2014-12-23 (×7): qty 1

## 2014-12-23 MED ORDER — FAMOTIDINE 20 MG PO TABS
20.0000 mg | ORAL_TABLET | Freq: Every day | ORAL | Status: DC
  Administered 2014-12-23: 20 mg via ORAL
  Filled 2014-12-23 (×2): qty 1

## 2014-12-23 NOTE — Progress Notes (Signed)
Medicare Important Message given? YES  (If response is "NO", the following Medicare IM given date fields will be blank)  Date Medicare IM given: 12/23/14 Medicare IM given by:  Alyiah Ulloa  

## 2014-12-23 NOTE — Progress Notes (Signed)
eLink Physician-Brief Progress Note Patient Name: Roger Mathews DOB: November 15, 1945 MRN: [id number]   Date of Service  12/23/2014  HPI/Events of Note  Cc sore throat  eICU Interventions  Add ppi/ h2hs and prn cepacol        Sandrea HughsMichael Shonna Deiter 12/23/2014, 10:00 PM

## 2014-12-23 NOTE — Progress Notes (Signed)
PULMONARY / CRITICAL CARE MEDICINE    Name: Roger Mathews MRN: [id number] DOB: 20-May-1946    ADMISSION DATE:  12/18/2014  PRIMARY SERVICE: PCCM  CHIEF COMPLAINT:  SOB  BRIEF PATIENT DESCRIPTION:  69 yo male smoker presented to [facility] hospital with dyspnea from Rt pneumothorax.  He has hx of GOLD D COPD with emphysema and was enrolled in hospice.  SIGNIFICANT EVENTS: 2/20 Transfer from [facility] hospital to [facility] 2/23 Change to -20 suction on chest tube 2/25 Change to -10 suction on chest tube  STUDIES:   SUBJECTIVE:  Breathing okay.  Denies chest pain.  VITAL SIGNS: Temp:  [98.1 F (36.7 C)-98.8 F (37.1 C)] 98.1 F (36.7 C) (02/25 0355) Pulse Rate:  [57-104] 87 (02/25 1050) Resp:  [18] 18 (02/25 0355) BP: (101-124)/(68-82) 101/68 mmHg (02/25 1050) SpO2:  [91 %-99 %] 91 % (02/25 0906) Weight:  [157 lb 10.1 oz (71.5 kg)] 157 lb 10.1 oz (71.5 kg) (02/25 0355) INTAKE / OUTPUT: Intake/Output      02/24 0701 - 02/25 0700 02/25 0701 - 02/26 0700   P.O. 720 240   Total Intake(mL/kg) 720 (10.1) 240 (3.4)   Urine (mL/kg/hr) 900 (0.5) 650 (2.3)   Total Output 900 650   Net -180 -410          PHYSICAL EXAMINATION: General: pleasante Neuro: normal strength HEENT: no sinus tenderness Cardiovascular: regular Lungs: decreased BS, no wheeze, crepitus in upper body b/l, Rt chest tube in place Abdomen: soft, non tender Musculoskeletal: no edema   LABS:  CBC  Recent Labs Lab 12/19/14 0302 12/20/14 0400 12/21/14 0438  WBC 13.2* 9.9 7.3  HGB 11.3* 12.2* 12.5*  HCT 37.9* 41.1 41.6  PLT 157 159 151   BMET  Recent Labs Lab 12/19/14 0302 12/20/14 0400 12/21/14 0438  NA 139 144 140  K 4.4 4.3 3.8  CL 98 97 94*  CO2 38* 43* 42*  BUN CREATININE 0.56 0.53 0.44*  GLUCOSE 140* 102* 101*   Electrolytes  Recent Labs Lab 12/19/14 0302 12/20/14 0400 12/21/14 0438  CALCIUM 8.6 8.9 9.0  MG 2.1  --   --   PHOS 2.8  --   --      Imaging Dg Chest Port 1  View  12/23/2014   CLINICAL DATA:  COPD.  Subcutaneous emphysema.  EXAM: PORTABLE CHEST - 1 VIEW  COMPARISON:  December 22, 2004  FINDINGS: Chest tube is noted on the right without change in position. There is widespread subcutaneous emphysema bilaterally which appears grossly stable. There is no apparent pneumothorax. There is atelectatic change in the left base, mild. Lungs elsewhere are clear. There is underlying emphysema. The heart size is normal. The pulmonary vascularity is within normal limits. No adenopathy.  IMPRESSION: No change in right-sided chest tube position. Extensive subcutaneous emphysema remains. No pneumothorax is apparent. Subcutaneous air of this degree could mask a small pneumothorax. There is no edema or consolidation. There is mild left base atelectasis. No change in cardiac silhouette.   Electronically Signed   By: Bretta Bang III M.D.   On: 12/23/2014 08:19   Dg Chest Port 1 View  12/22/2014   CLINICAL DATA:  Right-sided pneumothorax, COPD.  EXAM: PORTABLE CHEST - 1 VIEW  COMPARISON:  Portable chest x-ray of December 21, 2014  FINDINGS: The lungs remain hyperinflated. A definite right-sided pneumothorax is not observed today. There is no alveolar infiltrate. The interstitial markings are coarse at both lung bases but the hemidiaphragms are sharp.  The right-sided chest tube is unchanged in position overlying the lateral aspect of the seventh rib. A large amount of subcutaneous emphysema persists. There is no shift of the mediastinum. The heart and pulmonary vascularity are normal.  IMPRESSION: 1. Persistent extensive subcutaneous emphysema without obvious pneumothorax. Given the persistent subcutaneous emphysema, noncontrast CT scanning of the chest would be useful to exclude occult pneumothorax. 2. COPD with mild basilar fibrotic change.   Electronically Signed   By: David  Jordan   On: 12/22/2014 07:41    ASSESSMENT / PLAN:  Spontaneous Rt [place]neumothorax with subcutaneous  emphysema. Hx of GOLD D COPD with emphysema. Acute on chronic hypoxic/hypercapnic respiratory failure.  Plan:   - Change chest tube to -10 suction on 2/25 >> if stable then change to water seal 2/26 and chest CT chest - Smoking cessation, nicotine patch - continue symbicort, spiriva, and prn duoneb - defer thoracic surgery evaluation for now  Hx of atrial fibrillation, HLD Plan: - continue lopressor, ASA, pravachol  Depression/ anxiety. Plan:  Continue home remeron, zoloft, buspar  Goals of care >> was enrolled in hospice as outpt.  Expressed he would want aggressive resuscitative measures during this hospital stay.  Will continue to d/w pt depending on his progress.  Coralyn HellingVineet Rose Hippler, MD [facility] Pulmonary/Critical Care 12/23/2014, 10:54 AM Pager:  [phone] After 3pm call: [phone]

## 2014-12-24 ENCOUNTER — Inpatient Hospital Stay (HOSPITAL_COMMUNITY): Payer: Medicare Other

## 2014-12-24 NOTE — Progress Notes (Signed)
PULMONARY / CRITICAL CARE MEDICINE    Name: Roger Mathews MRN: [id number] DOB: [birth year]    ADMISSION DATE:  12/18/2014  PRIMARY SERVICE: PCCM  CHIEF COMPLAINT:  SOB  BRIEF PATIENT DESCRIPTION:  69 yo male smoker presented to [facility] hospital with dyspnea from Rt pneumothorax.  He has hx of GOLD D COPD with emphysema and was enrolled in hospice.  SIGNIFICANT EVENTS: 2/20 Transfer from [facility] hospital to [facility] 2/23 Change to -20 suction on chest tube 2/25 Change to -10 suction on chest tube  STUDIES:   SUBJECTIVE:  Breathing okay.  Denies chest pain.  Had sore throat.  VITAL SIGNS: Temp:  [97.6 F (36.4 C)-98 F (36.7 C)] 97.6 F (36.4 C) (02/26 0825) Pulse Rate:  [61-93] 68 (02/26 0825) Resp:  [16-18] 16 (02/26 0825) BP: (94-113)/(48-82) 108/72 mmHg (02/26 0825) SpO2:  [96 %-98 %] 96 % (02/26 0825) Weight:  [159 lb 6.4 oz (72.303 kg)] 159 lb 6.4 oz (72.303 kg) (02/26 0643) INTAKE / OUTPUT: Intake/Output      02/25 0701 - 02/26 0700 02/26 0701 - 02/27 0700   P.O. 480 200   Total Intake(mL/kg) 480 (6.6) 200 (2.8)   Urine (mL/kg/hr) 2350 (1.4)    Chest Tube 16 (0)    Total Output 2366     Net -1886 +200          PHYSICAL EXAMINATION: General: pleasante Neuro: normal strength HEENT: no sinus tenderness Cardiovascular: regular Lungs: decreased BS, no wheeze, crepitus in upper body b/l, Rt chest tube in place >> no airleak 2/26 Abdomen: soft, non tender Musculoskeletal: no edema   LABS:  CBC  Recent Labs Lab 12/19/14 0302 12/20/14 0400 12/21/14 0438  WBC 13.2* 9.9 7.3  HGB 11.3* 12.2* 12.5*  HCT 37.9* 41.1 41.6  PLT 157 159 151   BMET  Recent Labs Lab 12/19/14 0302 12/20/14 0400 12/21/14 0438  NA 139 144 140  K 4.4 4.3 3.8  CL 98 97 94*  CO2 38* 43* 42*  BUN CREATININE 0.56 0.53 0.44*  GLUCOSE 140* 102* 101*   Electrolytes  Recent Labs Lab 12/19/14 0302 12/20/14 0400 12/21/14 0438  CALCIUM 8.6 8.9 9.0  MG 2.1  --   --    PHOS 2.8  --   --      Imaging Dg Chest Port 1 View  12/24/2014   CLINICAL DATA:  COPD.  Subcutaneous emphysema  EXAM: PORTABLE CHEST - 1 VIEW  COMPARISON:  December 23, 2014  FINDINGS: The right-sided chest tube position is unchanged. There is a questionable small pneumothorax in the medial, apical region on the right without tension component. Widespread subcutaneous emphysema remains. Left base atelectasis remains. Lungs elsewhere appear clear. No new opacity. Heart size is within normal limits. Pulmonary vascularity also within normal limits. No adenopathy. No bone lesions.  IMPRESSION: Extensive subcutaneous emphysema. Questionable small right medial apical pneumothorax with no change in chest tube position. No tension component. Lungs clear except for left base atelectasis. No change in cardiac silhouette.   Electronically Signed   By: Bretta Bang III M.D.   On: 12/24/2014 07:15   Dg Chest Port 1 View  12/23/2014   CLINICAL DATA:  COPD.  Subcutaneous emphysema.  EXAM: PORTABLE CHEST - 1 VIEW  COMPARISON:  December 22, 2004  FINDINGS: Chest tube is noted on the right without change in position. There is widespread subcutaneous emphysema bilaterally which appears grossly stable. There is no apparent pneumothorax. There is atelectatic change in  the left base, mild. Lungs elsewhere are clear. There is underlying emphysema. The heart size is normal. The pulmonary vascularity is within normal limits. No adenopathy.  IMPRESSION: No change in right-sided chest tube position. Extensive subcutaneous emphysema remains. No pneumothorax is apparent. Subcutaneous air of this degree could mask a small pneumothorax. There is no edema or consolidation. There is mild left base atelectasis. No change in cardiac silhouette.   Electronically Signed   By: Bretta BangWilliam  Woodruff III M.D.   On: 12/23/2014 08:19    ASSESSMENT / PLAN:  Spontaneous Rt pneumothorax with subcutaneous emphysema. Hx of GOLD D COPD with  emphysema. Acute on chronic hypoxic/hypercapnic respiratory failure.  Plan:   - Changed chest tube to -10 suction on 2/25, but ?small PTX on CXR 2/26 >> if stable then change to water seal 2/27 and check CT chest - Smoking cessation, nicotine patch - continue symbicort, spiriva, and prn duoneb - defer thoracic surgery evaluation for now  Hx of atrial fibrillation, HLD Plan: - continue lopressor, ASA, pravachol  Depression/ anxiety. Plan:  Continue home remeron, zoloft, buspar  Goals of care >> was enrolled in hospice as outpt.  Expressed he would want aggressive resuscitative measures during this hospital stay.  Will continue to d/w pt depending on his progress.  Coralyn HellingVineet Ona Rathert, MD [facility] Pulmonary/Critical Care 12/24/2014, 11:56 AM Pager:  [phone] After 3pm call: [phone]

## 2014-12-25 ENCOUNTER — Inpatient Hospital Stay (HOSPITAL_COMMUNITY): Payer: Medicare Other

## 2014-12-25 DIAGNOSIS — J438 Other emphysema: Secondary | ICD-10-CM

## 2014-12-25 LAB — CBC
HEMATOCRIT: 38 % — AB (ref 39.0–52.0)
HEMOGLOBIN: 11.3 g/dL — AB (ref 13.0–17.0)
MCH: 29.1 pg (ref 26.0–34.0)
MCHC: 29.7 g/dL — ABNORMAL LOW (ref 30.0–36.0)
MCV: 97.9 fL (ref 78.0–100.0)
Platelets: 187 10*3/uL (ref 150–400)
RBC: 3.88 MIL/uL — ABNORMAL LOW (ref 4.22–5.81)
RDW: 14.9 % (ref 11.5–15.5)
WBC: 6.6 10*3/uL (ref 4.0–10.5)

## 2014-12-25 LAB — BASIC METABOLIC PANEL
Anion gap: 6 (ref 5–15)
BUN: 14 mg/dL (ref 6–23)
CALCIUM: 8.3 mg/dL — AB (ref 8.4–10.5)
CO2: 40 mmol/L (ref 19–32)
Chloride: 96 mmol/L (ref 96–112)
Creatinine, Ser: 0.49 mg/dL — ABNORMAL LOW (ref 0.50–1.35)
GFR calc Af Amer: >90 mL/min (ref >90)
GLUCOSE: 116 mg/dL — AB (ref 70–99)
Potassium: 4.2 mmol/L (ref 3.5–5.1)
SODIUM: 142 mmol/L (ref 135–145)

## 2014-12-25 NOTE — Progress Notes (Signed)
Pt ambulated in hallway; front wheel rolling walker; 3L/O2; 1 assists; ambulated 150 ft; pt back in chair with call bell within reach; will continue to monitor.   Hermina BartersBOWMAN, Sholonda Jobst M, RN

## 2014-12-25 NOTE — Progress Notes (Signed)
PULMONARY / CRITICAL CARE MEDICINE    Name: Roger Mathews MRN: [id number] DOB: 1946-02-23    ADMISSION DATE:  12/18/2014  PRIMARY SERVICE: PCCM  CHIEF COMPLAINT:  SOB  BRIEF PATIENT DESCRIPTION:  69 yo male smoker presented [facility] with dyspnea from Rt pneumothorax.  He has hx of GOLD D COPD with emphysema and was enrolled in hospice.  SIGNIFICANT EVENTS: 2/20 [facility] Change to -20 suction on chest tube 2/25 Change to -10 suction on chest tube  STUDIES:   SUBJECTIVE:  No change overnight, no increased wob.  Small air leak at times per nursing, but no air leak currently, even with coughing.   CXR with small right apical PTX  VITAL SIGNS: Temp:  [98 F (36.7 C)-98.2 F (36.8 C)] 98 F (36.7 C) (02/27 0606) Pulse Rate:  [88-103] 96 (02/27 0606) Resp:  [16-18] 18 (02/27 0606) BP: (92-120)/(61-88) 120/88 mmHg (02/27 0945) SpO2:  [97 %-99 %] 98 % (02/27 0606) Weight:  [72.757 kg (160 lb 6.4 oz)] 72.757 kg (160 lb 6.4 oz) (02/27 0606) INTAKE / OUTPUT: Intake/Output      02/26 0701 - 02/27 0700 02/27 0701 - 02/28 0700   P.O. 400 100   Total Intake(mL/kg) 400 (5.5) 100 (1.4)   Urine (mL/kg/hr) 975 (0.6)    Chest Tube 6 (0)    Total Output 981     Net -581 +100          PHYSICAL EXAMINATION: General: WD male in nad Neuro: alert and oriented, moves all 4 HEENT: no purulence or d/c noted. Cardiovascular: rrr Lungs: +subq air, decreased bs, no wheezing Abdomen: soft, non tender Musculoskeletal: no edema or cyanosis  LABS:  CBC  Recent Labs Lab 12/20/14 0400 12/21/14 0438 12/25/14 0313  WBC 9.9 7.3 6.6  HGB 12.2* 12.5* 11.3*  HCT 41.1 41.6 38.0*  PLT 159 151 187   BMET  Recent Labs Lab 12/20/14 0400 12/21/14 0438 12/25/14 0313  NA 144 140 142  K 4.3 3.8 4.2  CL 97 94* 96  CO2 43* 42* 40*  BUN 15 10 14   CREATININE 0.53 0.44* 0.49*  GLUCOSE 102* 101* 116*   Electrolytes  Recent Labs Lab  12/19/14 0302 12/20/14 0400 12/21/14 0438 12/25/14 0313  CALCIUM 8.6 8.9 9.0 8.3*  MG 2.1  --   --   --   PHOS 2.8  --   --   --      Imaging Dg Chest Port 1 View  12/25/2014   CLINICAL DATA:  Right-sided pneumothorax  EXAM: PORTABLE CHEST - 1 VIEW  COMPARISON:  12/24/2014  FINDINGS: Cardiac shadow is stable. A right-sided thoracostomy tube is again seen but has withdrawn slightly in the interval from the prior exam. The right lung is well aerated. Some very minimal pneumothorax remains in the apex. No focal infiltrate is seen. The left lung remains well aerated. Diffuse subcutaneous emphysema in the chest and neck is seen and stable. No new bony abnormality is noted.  IMPRESSION: Tiny residual right pneumothorax.  The remainder of the exam is stable with some slight withdrawal of the thoracostomy catheter on the right.   Electronically Signed   By: Alcide CleverMark  Lukens M.D.   On: 12/25/2014 07:13   Dg Chest Port 1 View  12/24/2014   CLINICAL DATA:  COPD.  Subcutaneous emphysema  EXAM: PORTABLE CHEST - 1 VIEW  COMPARISON:  December 23, 2014  FINDINGS: The right-sided chest tube position is unchanged. There  is a questionable small pneumothorax in the medial, apical region on the right without tension component. Widespread subcutaneous emphysema remains. Left base atelectasis remains. Lungs elsewhere appear clear. No new opacity. Heart size is within normal limits. Pulmonary vascularity also within normal limits. No adenopathy. No bone lesions.  IMPRESSION: Extensive subcutaneous emphysema. Questionable small right medial apical pneumothorax with no change in chest tube position. No tension component. Lungs clear except for left base atelectasis. No change in cardiac silhouette.   Electronically Signed   By: Bretta Bang III M.D.   On: 12/24/2014 07:15    ASSESSMENT / PLAN:  Spontaneous Rt pneumothorax with subcutaneous emphysema. Hx of GOLD D COPD with emphysema. Acute on chronic  hypoxic/hypercapnic respiratory failure.  Plan:   -will change to water seal and check cxr in am -continue BD

## 2014-12-25 NOTE — Progress Notes (Signed)
12/25/2014 6:42 AM The patient had a critical value this AM. His CO2 level was 40.  The physician was informed and no new orders were given. Will continue to monitor the patient. Harriet Massonavidson, Rachelle Edwards E

## 2014-12-26 ENCOUNTER — Inpatient Hospital Stay (HOSPITAL_COMMUNITY): Payer: Medicare Other

## 2014-12-26 NOTE — Progress Notes (Signed)
PULMONARY / CRITICAL CARE MEDICINE    Name: Roger Mathews MRN: [id number] DOB: Mar 21, 1946    ADMISSION DATE:  12/18/2014  PRIMARY SERVICE: PCCM  CHIEF COMPLAINT:  SOB  BRIEF PATIENT DESCRIPTION:  69 yo male smoker [facility] with dyspnea from Rt pneumothorax.  He has hx of GOLD D COPD with emphysema and was enrolled in hospice.  SIGNIFICANT EVENTS: 2/20 [facility] Change to -20 suction on chest tube 2/25 Change to -10 suction on chest tube 2/27:  To water seal  STUDIES:   SUBJECTIVE:  Has done well overnight on water seal, although small air leak this am.  cxr with no definite ptx.  No breathing issues overnight.   VITAL SIGNS: Temp:  [97.9 F (36.6 C)-98.9 F (37.2 C)] 97.9 F (36.6 C) (02/28 0451) Pulse Rate:  [91-103] 91 (02/28 0451) Resp:  [17-18] 18 (02/28 0451) BP: (111-120)/(73-89) 120/73 mmHg (02/28 0451) SpO2:  [98 %] 98 % (02/28 0925) Weight:  [72.7 kg (160 lb 4.4 oz)] 72.7 kg (160 lb 4.4 oz) (02/28 0451) INTAKE / OUTPUT: Intake/Output      02/27 0701 - 02/28 0700 02/28 0701 - 02/29 0700   P.O. 580    Total Intake(mL/kg) 580 (8)    Urine (mL/kg/hr) 1000 (0.6)    Stool 0 (0)    Chest Tube     Total Output 1000     Net -420          Urine Occurrence 1 x    Stool Occurrence 1 x      PHYSICAL EXAMINATION: General: WD male in nad Neuro: alert and oriented, moves all 4 HEENT: no purulence or d/c noted. Cardiovascular: rrr Lungs: +subq air, decreased bs, no wheezing Abdomen: soft, non tender Musculoskeletal: no edema or cyanosis  LABS:  CBC  Recent Labs Lab 12/20/14 0400 12/21/14 0438 12/25/14 0313  WBC 9.9 7.3 6.6  HGB 12.2* 12.5* 11.3*  HCT 41.1 41.6 38.0*  PLT 159 151 187   BMET  Recent Labs Lab 12/20/14 0400 12/21/14 0438 12/25/14 0313  NA 144 140 142  K 4.3 3.8 4.2  CL 97 94* 96  CO2 43* 42* 40*  BUN 15 10 14   CREATININE 0.53 0.44* 0.49*  GLUCOSE 102* 101* 116*    Electrolytes  Recent Labs Lab 12/20/14 0400 12/21/14 0438 12/25/14 0313  CALCIUM 8.9 9.0 8.3*     Imaging Dg Chest Port 1 View  12/26/2014   CLINICAL DATA:  Pneumothorax.  EXAM: PORTABLE CHEST - 1 VIEW  COMPARISON:  Radiograph 12/25/2014  FINDINGS: Right chest tube unchanged. No right pneumothorax appreciated. Lungs are hyperinflated. Extensive subcutaneous emphysema noted.  IMPRESSION: No significant change. Right chest tube in place without pneumothorax.  Extensive subcutaneous emphysema.   Electronically Signed   By: Genevive BiStewart  Edmunds M.D.   On: 12/26/2014 09:23   Dg Chest Port 1 View  12/25/2014   CLINICAL DATA:  Right-sided pneumothorax  EXAM: PORTABLE CHEST - 1 VIEW  COMPARISON:  12/24/2014  FINDINGS: Cardiac shadow is stable. A right-sided thoracostomy tube is again seen but has withdrawn slightly in the interval from the prior exam. The right lung is well aerated. Some very minimal pneumothorax remains in the apex. No focal infiltrate is seen. The left lung remains well aerated. Diffuse subcutaneous emphysema in the chest and neck is seen and stable. No new bony abnormality is noted.  IMPRESSION: Tiny residual right pneumothorax.  The remainder of the exam is stable with  some slight withdrawal of the thoracostomy catheter on the right.   Electronically Signed   By: Alcide Clever M.D.   On: 12/25/2014 07:13    ASSESSMENT / PLAN:  Spontaneous Rt pneumothorax with subcutaneous emphysema. Hx of GOLD D COPD with emphysema. Acute on chronic hypoxic/hypercapnic respiratory failure.  Plan:   -leave tube in today with +air leak despite no ptx on cxr.  ?d/c in am.  -continue BD

## 2014-12-27 ENCOUNTER — Inpatient Hospital Stay (HOSPITAL_COMMUNITY): Payer: Medicare Other

## 2014-12-27 DIAGNOSIS — J860 Pyothorax with fistula: Secondary | ICD-10-CM

## 2014-12-27 DIAGNOSIS — J432 Centrilobular emphysema: Secondary | ICD-10-CM

## 2014-12-27 DIAGNOSIS — E46 Unspecified protein-calorie malnutrition: Secondary | ICD-10-CM

## 2014-12-27 DIAGNOSIS — J449 Chronic obstructive pulmonary disease, unspecified: Secondary | ICD-10-CM

## 2014-12-27 NOTE — Progress Notes (Signed)
Medicare Important Message given? YES (If response is "NO", the following Medicare IM given date fields will be blank) Date Medicare IM given:12/27/2014 Medicare IM given by: Cataleyah Colborn 

## 2014-12-27 NOTE — Progress Notes (Signed)
PULMONARY / CRITICAL CARE MEDICINE    Name: Roger Mathews MRN: [id number] DOB: 24-Aug-1946    ADMISSION DATE:  12/18/2014  PRIMARY SERVICE: PCCM  CHIEF COMPLAINT:  SOB  BRIEF PATIENT DESCRIPTION:  69 y/o male smoker presented to [facility] hospital with dyspnea from Rt pneumothorax.  He has hx of GOLD D COPD with emphysema and was enrolled in hospice.  SIGNIFICANT EVENTS: 2/20  Transfer from [facility] hospital to [facility] 2/23  Change to -20 suction on chest tube 2/25  Change to -10 suction on chest tube 2/27  CT to water seal 2/28  Small air leak on water seal, cxr with no definite PTX 2/29  No air leak on exam.     STUDIES:    SUBJECTIVE:  Pt denies SOB, chest pain, sputum production   VITAL SIGNS: Temp:  [98.2 F (36.8 C)-98.3 F (36.8 C)] 98.3 F (36.8 C) (02/29 0443) Pulse Rate:  [79-103] 92 (02/29 0443) Resp:  [18] 18 (02/29 0443) BP: (103-109)/(67-70) 106/67 mmHg (02/29 0443) SpO2:  [97 %-98 %] 98 % (02/29 0443) Weight:  [162 lb 11.2 oz (73.8 kg)] 162 lb 11.2 oz (73.8 kg) (02/29 0443)   INTAKE / OUTPUT: Intake/Output      02/28 0701 - 02/29 0700 02/29 0701 - 03/01 0700   P.O. 600    Total Intake(mL/kg) 600 (8.1)    Urine (mL/kg/hr) 1600 (0.9)    Stool     Chest Tube 10 (0)    Total Output 1610     Net -1010            PHYSICAL EXAMINATION: General: chronically ill, WD male in nad Neuro: alert and oriented, MAE HEENT: no purulence or d/c noted, mm pink/moist Cardiovascular: s1s2 rrr, no m/r/g Lungs: +subq air, decreased bs, few faint wheezes on L  Abdomen: soft, non tender Musculoskeletal: no edema or cyanosis  LABS:  CBC  Recent Labs Lab 12/21/14 0438 12/25/14 0313  WBC 7.3 6.6  HGB 12.5* 11.3*  HCT 41.6 38.0*  PLT 151 187   BMET  Recent Labs Lab 12/21/14 0438 12/25/14 0313  NA 140 142  K 3.8 4.2  CL 94* 96  CO2 42* 40*  BUN 10 14  CREATININE 0.44* 0.49*  GLUCOSE 101* 116*   Electrolytes  Recent Labs Lab 12/21/14 0438  12/25/14 0313  CALCIUM 9.0 8.3*    Imaging Dg Chest Port 1 View  12/26/2014   CLINICAL DATA:  Pneumothorax.  EXAM: PORTABLE CHEST - 1 VIEW  COMPARISON:  Radiograph 12/25/2014  FINDINGS: Right chest tube unchanged. No right pneumothorax appreciated. Lungs are hyperinflated. Extensive subcutaneous emphysema noted.  IMPRESSION: No significant change. Right chest tube in place without pneumothorax.  Extensive subcutaneous emphysema.   Electronically Signed   By: Genevive Bi M.D.   On: 12/26/2014 09:23    ASSESSMENT / PLAN:  Spontaneous R Pneumothorax with Subcutaneous Emphysema. Hx of GOLD D COPD with emphysema. Acute on chronic hypoxic/hypercapnic respiratory failure.   Plan:   -No air leak on exam, repeat CXR now.  If lung remains up, will d/c CT and repeat film in 4 hours -Continue O2 at 3L (baseline requirement)  -Continue symbicort, spiriva -PRN Duoneb -will need outpatient follow up at the [facility] upon discharge   Canary Brim, NP-C [facility] Pgr: [phone] or [phone]  Attending:  I have seen and examined the patient with nurse practitioner/resident and agree with the note above.   Unfortunately on my exam he had an air leak  Lungs with very poor air movement but he is comfortable Lots of sub-cutaneous air  Pneumothorax> still has air leak, will order CT chest to ensure tube in good position, continue water seal COPD> not in exacerbation Protein calorie malnutrition > dietary consult to help ensure adequate wound healing  Heber CarolinaBrent Mikhala Kenan, MD [place] [facility] Pager: [phone] Cell: [phone] If no response, call [phone]

## 2014-12-28 ENCOUNTER — Inpatient Hospital Stay (HOSPITAL_COMMUNITY): Payer: Medicare Other

## 2014-12-28 DIAGNOSIS — J449 Chronic obstructive pulmonary disease, unspecified: Secondary | ICD-10-CM

## 2014-12-28 LAB — CBC
HEMATOCRIT: 38.8 % — AB (ref 39.0–52.0)
HEMOGLOBIN: 11.5 g/dL — AB (ref 13.0–17.0)
MCH: 29 pg (ref 26.0–34.0)
MCHC: 29.6 g/dL — AB (ref 30.0–36.0)
MCV: 98 fL (ref 78.0–100.0)
Platelets: 201 10*3/uL (ref 150–400)
RBC: 3.96 MIL/uL — AB (ref 4.22–5.81)
RDW: 14.9 % (ref 11.5–15.5)
WBC: 7 10*3/uL (ref 4.0–10.5)

## 2014-12-28 LAB — BASIC METABOLIC PANEL
ANION GAP: 3 — AB (ref 5–15)
BUN: 8 mg/dL (ref 6–23)
CO2: 46 mmol/L — AB (ref 19–32)
CREATININE: 0.5 mg/dL (ref 0.50–1.35)
Calcium: 8.3 mg/dL — ABNORMAL LOW (ref 8.4–10.5)
Chloride: 93 mmol/L — ABNORMAL LOW (ref 96–112)
Glucose, Bld: 90 mg/dL (ref 70–99)
POTASSIUM: 3.5 mmol/L (ref 3.5–5.1)
Sodium: 142 mmol/L (ref 135–145)

## 2014-12-28 MED ORDER — ENSURE COMPLETE PO LIQD
237.0000 mL | Freq: Two times a day (BID) | ORAL | Status: DC
  Administered 2014-12-29 (×2): 237 mL via ORAL

## 2014-12-28 NOTE — Progress Notes (Signed)
PULMONARY / CRITICAL CARE MEDICINE    Name: Roger Mathews MRN: [id number] DOB: 12-31-1945    ADMISSION DATE:  12/18/2014  PRIMARY SERVICE: PCCM  CHIEF COMPLAINT:  SOB  BRIEF PATIENT DESCRIPTION:  69 y/o male smoker presented to [facility] hospital with dyspnea from Rt pneumothorax.  He has hx of GOLD D COPD with emphysema and was enrolled in hospice.  SIGNIFICANT EVENTS: 2/20  Transfer from [facility] to [facility] 2/23  Change to -20 suction on chest tube 2/25  Change to -10 suction on chest tube 2/27  CT to water seal 2/28  Small air leak on water seal, cxr with no definite PTX 2/29  Persistent air leak> back to suction  STUDIES:  2/29 CT chest > R hydropneumothorax, moderate emphysema, dependent atelectasis, atherosclerosis or coronary arteries, aortic valve calcification, multiple renal lesions bilaterally, likely cysts, one 4.2cm lesion left kidney with calcification> would evaluate with MRI in 6 months  SUBJECTIVE:  Pt denies SOB, chest pain, sputum production   VITAL SIGNS: Temp:  [97.7 F (36.5 C)-99.3 F (37.4 C)] 97.7 F (36.5 C) (03/01 0506) Pulse Rate:  [90-97] 90 (03/01 0506) Resp:  [20-21] 20 (03/01 0506) BP: (96-112)/(52-73) 98/52 mmHg (03/01 0506) SpO2:  [97 %-99 %] 98 % (03/01 0845) Weight:  [73.573 kg (162 lb 3.2 oz)] 73.573 kg (162 lb 3.2 oz) (03/01 0500)   INTAKE / OUTPUT: Intake/Output      02/29 0701 - 03/01 0700 03/01 0701 - 03/02 0700   P.O. 360    Total Intake(mL/kg) 360 (4.9)    Urine (mL/kg/hr) 1000 (0.6)    Chest Tube  0 (0)   Total Output 1000 0   Net -640 0        Urine Occurrence 1 x    Stool Occurrence 1 x      PHYSICAL EXAMINATION: General: chronically ill, WD male in nad Neuro: alert and oriented, MAE HEENT: no purulence or d/c noted, mm pink/moist Cardiovascular: s1s2 rrr, no m/r/g Lungs: +subq air, decreased bs, few faint wheezes on L  Abdomen: soft, non tender Musculoskeletal: no edema or cyanosis  LABS:  CBC  Recent  Labs Lab 12/25/14 0313 12/28/14 0457  WBC 6.6 7.0  HGB 11.3* 11.5*  HCT 38.0* 38.8*  PLT 187 201   BMET  Recent Labs Lab 12/25/14 0313 12/28/14 0457  NA 142 142  K 4.2 3.5  CL 96 93*  CO2 40* 46*  BUN 14 8  CREATININE 0.49* 0.50  GLUCOSE 116* 90   Electrolytes  Recent Labs Lab 12/25/14 0313 12/28/14 0457  CALCIUM 8.3* 8.3*    Imaging Ct Chest Wo Contrast  12/27/2014   CLINICAL DATA:  Subsequent evaluation of a 70 year old male with COPD 80 recently presented with acute shortness of breath, found to have a large pneumothorax on the right side. Status post chest tube placement.  EXAM: CT CHEST WITHOUT CONTRAST  TECHNIQUE: Multidetector CT imaging of the chest was performed following the standard protocol without IV contrast.  COMPARISON:  Chest x-ray 12/27/2014.  FINDINGS: Mediastinum/Lymph Nodes: Heart size is normal. There is no significant pericardial fluid, thickening or pericardial calcification. Severe calcifications of the aortic valve. There is atherosclerosis of the thoracic aorta, the great vessels of the mediastinum and the coronary arteries, including calcified atherosclerotic plaque in the left main, left anterior descending, left circumflex and right coronary arteries. No pathologically enlarged mediastinal or hilar lymph nodes. Please note that accurate exclusion of hilar adenopathy is limited on noncontrast CT scans. Esophagus  is unremarkable in appearance. Pneumomediastinum in the upper mediastinum, likely tracking caudally from the lower cervical regions. No axillary lymphadenopathy.  Lungs/Pleura: Right-sided chest tube in position lead tip in the lateral aspect of the right hemithorax at the level of the carina. Side-port appears to be within the right hemithorax demonstrated on image 29 of series 205. Moderate right-sided hydropneumothorax with moderate volume of gas and small volume of fluid in the right pleural space. Dependent passive atelectasis is noted in  the lower lobes of the lungs bilaterally. Moderate centrilobular and paraseptal emphysema with numerous bilateral upper lung subpleural bullae and blebs. Small calcified granuloma in the lingula. Patchy areas of bronchial wall thickening, thickening of the peribronchovascular interstitium and peribronchovascular micronodularity, most evident in the left lower lobe and periphery of the right upper lobe, favored to reflect areas of mucoid impaction within terminal bronchioles. No larger more suspicious appearing pulmonary nodules or masses are otherwise noted.  Upper Abdomen: Multiple low-attenuation lesions in the visualized portions of the kidneys bilaterally, favored predominantly to reflect simple cysts, however, these are incompletely characterized on today's non contrast CT examination. The largest of these lesions measures 4.2 cm extending exophytically from the anterior aspect of the upper pole left kidney. This lesion in particular appears to have a tiny focus of peripheral calcification (image 68 series 201) which is likely mural. Multiple small calcified granulomas in the liver and spleen. Numerous calcified granulomas in the liver and spleen. Several small low-attenuation lesions in the liver are incompletely characterized on today's noncontrast CT examination. The largest of these measures only 1.3 cm in segment 8; these are favored to represent small cysts.  Musculoskeletal/Soft Tissues: There are no aggressive appearing lytic or blastic lesions noted in the visualized portions of the skeleton. Extensive subcutaneous emphysema is noted throughout the chest wall bilaterally (right greater than left), tracking cephalad into the cervical regions bilaterally, and tracking caudally into the right anterior abdominal wall and right posterior flank, with some extension across midline posteriorly.  IMPRESSION: 1. Right-sided chest tube in position with moderate right hydropneumothorax and a large volume of  subcutaneous emphysema in the chest wall, as above. 2. Moderate centrilobular and paraseptal emphysema, most severe in the lung apices. 3. Dependent passive subsegmental atelectasis in the lower lobes of the lungs bilaterally. 4. Atherosclerosis, including left main and 3 vessel coronary artery disease. Please note that although the presence of coronary artery calcium documents the presence of coronary artery disease, the severity of this disease and any potential stenosis cannot be assessed on this non-gated CT examination. Assessment for potential risk factor modification, dietary therapy or pharmacologic therapy may be warranted, if clinically indicated. 5. There are calcifications of the aortic valve. Echocardiographic correlation for evaluation of potential valvular dysfunction may be warranted if clinically indicated. 6. Multiple renal lesions bilaterally, favored to predominantly reflect cysts. There is one 4.2 cm lesion in the anterior aspect of the upper pole of the left kidney which has a small amount of calcification in the wall (likely a Bosniak class IIF lesion). Further evaluation with nonemergent MRI of the abdomen with and without IV gadolinium in 6 months is recommended to better characterize this finding. This recommendation follows ACR consensus guidelines: Managing Incidental Findings on Abdominal CT: White Paper of the ACR Incidental Findings Committee. J Am Coll Radiol 2010;7:754-773.   Electronically Signed   By: Trudie Reed M.D.   On: 12/27/2014 14:59   Dg Chest Port 1 View  12/28/2014   CLINICAL DATA:  Pneumothorax.  EXAM: PORTABLE CHEST - 1 VIEW  COMPARISON:  December 27, 2014.  FINDINGS: Right-sided chest tube is unchanged in position with side hole barely within the thoracic space. Continued presence of extensive subcutaneous emphysema is seen bilaterally. Mild pneumomediastinum is noted superiorly. Possible pneumothorax is noted laterally on the right, although this may represent  overlying soft tissue artifact. Due to the extensive amount of subcutaneous emphysema, it is difficult to evaluate for pneumothorax. Stable cardiomediastinal silhouette. No significant pleural effusion is noted.  IMPRESSION: There remains extensive amount of subcutaneous emphysema bilaterally which may obscure potential pneumothorax. There is the suggestion of a mild right pneumothorax laterally, but this may simply represent overlying soft tissue artifact. Right-sided chest tube is unchanged in position. Mild pneumomediastinum is noted superiorly.   Electronically Signed   By: Lupita RaiderJames  Green Jr, M.D.   On: 12/28/2014 08:07   Dg Chest Port 1 View  12/27/2014   CLINICAL DATA:  Right-sided chest tube, pneumothorax  EXAM: PORTABLE CHEST - 1 VIEW  COMPARISON:  12/26/2014  FINDINGS: There is a right-sided chest tube in unchanged position. There is no definite pneumothorax. There is extensive soft tissue emphysema involving the right and left chest wall. There is no focal consolidation. There is mild bilateral chronic bronchitic change. There is no pleural effusion. The cardiomediastinal silhouette is stable.  The osseous structures are unremarkable.  IMPRESSION: Right-sided chest tube in unchanged position without a pneumothorax. Extensive chest wall soft tissue emphysema concerning for an air leak.   Electronically Signed   By: Elige KoHetal  Patel   On: 12/27/2014 11:01    ASSESSMENT / PLAN:  Spontaneous R Pneumothorax with Subcutaneous Emphysema > no air leak after 24 hours suction 3/1 Hx of GOLD D COPD with emphysema Acute on chronic hypoxic/hypercapnic respiratory failure Protein calorie malnutrition Physical Deconditioning Kidney cysts noted, radiology recommends MRI in 6 months  Plan:   -changed chest tube back to water seal 3/1, CXR now and in AM -ambulate -Continue O2 at 3L (baseline requirement)  -Continue symbicort, spiriva -PRN Duoneb -MRI kidney 6 months -[facility]   Heber CarolinaBrent Rethel Sebek, MD [place] [facility] Pager: [phone] Cell: [phone] If no response, call [phone]

## 2014-12-28 NOTE — Progress Notes (Signed)
Dr. Kendrick FriesMcquaid aware of critical CO2. Darrel HooverWilson,Johnae Friley S 7:36 AM

## 2014-12-28 NOTE — Progress Notes (Signed)
Utilization review completed.  

## 2014-12-28 NOTE — Progress Notes (Signed)
INITIAL NUTRITION ASSESSMENT  DOCUMENTATION CODES Per approved criteria  -Severe malnutrition in the context of chronic illness   INTERVENTION: Ensure Complete po BID, each supplement provides 350 kcal and 13 grams of protein  NUTRITION DIAGNOSIS: Malnutrition related to chronic illness as evidenced by severe fat and muscle depletion.   Goal: Pt to meet >/= 90% of their estimated nutrition needs   Monitor:  PO intake, supplement acceptance, labs  Reason for Assessment: MD Consult  69 y.o. male   ASSESSMENT: Pt admitted to [facility] hospital and transferred to [facility] with large right pneumothorax. Pt with hx of end-stage/GOLD COPD with emphysema, continues to smoke, and enrolled in hospice.  Chest tube in place, changed to water seal 2/27, air leak noted 2/28, chest tube back to suction 2/29.   Per pt he lost a lot of weight over a year ago due to poor appetite. Pt feels his appetite is much improved and that he has gained some of his weight back. He went from 180 lb to 140 lb but is now back up to 162 lb and eating 100% of his meals.  Nutrition Focused Physical Exam:  Subcutaneous Fat:  Orbital Region: severe depletion Upper Arm Region: mild depletion Thoracic and Lumbar Region: WDL  Muscle:  Temple Region: severe depletion Clavicle Bone Region: WDL Clavicle and Acromion Bone Region: WDL Scapular Bone Region: WDL Dorsal Hand: mild/moderate depletion Patellar Region: WDL Anterior Thigh Region: WDL Posterior Calf Region: mild/moderate depletion  Edema: not presetn   Height: Ht Readings from Last 1 Encounters:  12/19/14  (1.778 m)    Weight: Wt Readings from Last 1 Encounters:  12/28/14 162 lb 3.2 oz (73.573 kg)    Ideal Body Weight: 75.4 kg  % Ideal Body Weight: 97%  Wt Readings from Last 10 Encounters:  12/28/14 162 lb 3.2 oz (73.573 kg)    Usual Body Weight: 180 lb   % Usual Body Weight: 90%  BMI:  Body mass index is 23.27 kg/(m^2).  Estimated  Nutritional Needs: Kcal: 1800-2000 Protein: 90-105 grams Fluid: > 1.8 L/day  Skin: right chest tube  Diet Order: Diet regular Meal Completion: 100%  EDUCATION NEEDS: -No education needs identified at this time   Intake/Output Summary (Last 24 hours) at 12/28/14 1020 Last data filed at 12/28/14 0807  Gross per 24 hour  Intake    240 ml  Output    600 ml  Net   -360 ml    Last BM: 3/1   Labs:   Recent Labs Lab 12/25/14 0313 12/28/14 0457  NA 142 142  K 4.2 3.5  CL 96 93*  CO2 40* 46*  BUN 14 8  CREATININE 0.49* 0.50  CALCIUM 8.3* 8.3*  GLUCOSE 116* 90    CBG (last 3)  No results for input(s): GLUCAP in the last 72 hours.  Scheduled Meds: . aspirin  81 mg Oral Daily  . budesonide-formoterol  2 puff Inhalation BID  . busPIRone  10 mg Oral TID  . heparin  5,000 Units Subcutaneous 3 times per day  . metoprolol tartrate  12.5 mg Oral BID  . mirtazapine  7.5 mg Oral QHS  . multivitamin with minerals  1 tablet Oral Daily  . nicotine  14 mg Transdermal Daily  . pantoprazole  40 mg Oral Daily  . pravastatin  80 mg Oral Daily  . sertraline  100 mg Oral Daily  . tiotropium  18 mcg Inhalation Daily    Continuous Infusions:   Past Medical History  Diagnosis Date  . COPD (chronic obstructive pulmonary disease)   . Anxiety   . Depression   . HTN (hypertension)   . Atrial fibrillation     Past Surgical History  Procedure Laterality Date  . Tonsilectomy/adenoidectomy with myringotomy      Kendell BaneHeather Latissa Frick RD, LDN, CNSC [phone] Pager [phone] After Hours Pager

## 2014-12-28 NOTE — Care Management Note (Signed)
    Page 1 of 2   01/05/2015     10:43:30 AM CARE MANAGEMENT NOTE 01/05/2015  Patient:  Baird LyonsGODWIN,Kerrie   Account Number:  [id number]  Date Initiated:  12/22/2014  Documentation initiated by:  Donn PieriniWEBSTER,KRISTI  Subjective/Objective Assessment:   Pt admitted with pntx- chest tube placement     Action/Plan:   PTA pt lived at home with family- active with home hospice out of [place], has cane and RW at home and home 02   Anticipated DC Date:  12/31/2014   Anticipated DC Plan:  HOME W HOSPICE CARE      DC Planning Services  CM consult      Choice offered to / List presented to:             Status of service:  In process, will continue to follow Medicare Important Message given?  YES (If response is "NO", the following Medicare IM given date fields will be blank) Date Medicare IM given:  12/23/2014 Medicare IM given by:  Donn PieriniWEBSTER,KRISTI Date Additional Medicare IM given:  01/05/2015 Additional Medicare IM given [facility]:  [facility] Der[facility]  Discharge Disposition:    Per UR Regulation:  Reviewed for med. necessity/level of care/duration of stay  If discussed at Long Length of Stay Meetings, dates discussed:   12/23/2014  12/28/2014  12/30/2014    Comments:  PCP- Judie PetitSusan [place]ak[place] ([place])  01-05-15 10:41am Avie ArenasSarah Dimitria Ketchum, RNBSN [phone] Verified with pateint that he did have hospice agency listed below at home.  Will just need resumption of orders to fax out prior to discharge.  12/28/14- 1500- Donn PieriniKristi Webster RN, BSN [phone] [facility] - [phone]) spoke with Crystal- per conversation pt was active with them PTA for endstage COPD- pt will need resumption of Home Hospice orders for discharge- will need to fax orders to [phone]). Will notify agency when d/c date known and fax orders when available.  12/27/14- 1500- Donn PieriniKristi Webster RN, BSN [phone] Called and spoke with pt's wife- confirmed that pt is home with hospice- name of agency unknown- wife did give CM  phone # to agency [phone]) will call tomorrow and speak with agency to determine what documentation is needed to resume home hospice services at time of discharge.

## 2014-12-28 NOTE — Progress Notes (Signed)
Critical value this AM - CO2 46.  Pt's last CO2 40.  Pt alert and oriented.  MD paged, a/w return call.  Value communicated to day shift RN.  Will continue to monitor.  Erenest Rasheraldwell,Alcus Bradly B, RN

## 2014-12-29 ENCOUNTER — Inpatient Hospital Stay (HOSPITAL_COMMUNITY): Payer: Medicare Other

## 2014-12-29 DIAGNOSIS — E43 Unspecified severe protein-calorie malnutrition: Secondary | ICD-10-CM | POA: Insufficient documentation

## 2014-12-29 NOTE — Progress Notes (Signed)
Medicare Important Message given? YES (If response is "NO", the following Medicare IM given date fields will be blank) Date Medicare IM given:12/29/2014 Medicare IM given by: Olyn Landstrom 

## 2014-12-29 NOTE — Progress Notes (Signed)
PULMONARY / CRITICAL CARE MEDICINE    Name: Roger Mathews MRN: [id number] DOB: [birth year]    ADMISSION DATE:  12/18/2014  PRIMARY SERVICE: PCCM  CHIEF COMPLAINT:  SOB  BRIEF PATIENT DESCRIPTION:  69 y/o male smoker [facility] with dyspnea from Rt pneumothorax.  He has hx of GOLD D COPD with emphysema and was enrolled in hospice.  SIGNIFICANT EVENTS: 2/20  [facility]  Change to -20 suction on chest tube 2/25  Change to -10 suction on chest tube 2/27  CT to water seal 2/28  Small air leak on water seal, cxr with no definite PTX 2/29  Persistent air leak > back to suction 3/01  CT to water seal  STUDIES:  2/29 CT chest > R hydropneumothorax, moderate emphysema, dependent atelectasis, atherosclerosis or coronary arteries, aortic valve calcification, multiple renal lesions bilaterally, likely cysts, one 4.2cm lesion left kidney with calcification> would evaluate with MRI in 6 months  SUBJECTIVE:  Pt denies SOB, chest pain, sputum production, fevers/chills.  CT to water seal.  No leak noted.   VITAL SIGNS: Temp:  [97.4 F (36.3 C)-98.3 F (36.8 C)] 97.4 F (36.3 C) (03/02 0451) Pulse Rate:  [87-106] 87 (03/02 0451) Resp:  [18-20] 18 (03/02 0451) BP: (92-107)/(62-70) 92/62 mmHg (03/02 0451) SpO2:  [95 %-99 %] 95 % (03/02 0451) Weight:  [162 lb (73.483 kg)] 162 lb (73.483 kg) (03/02 0451)   INTAKE / OUTPUT: Intake/Output      03/01 0701 - 03/02 0700 03/02 0701 - 03/03 0700   P.O. 360 120   Total Intake(mL/kg) 360 (4.9) 120 (1.6)   Urine (mL/kg/hr) 2000 (1.1) 350 (1.1)   Chest Tube 0 (0)    Total Output 2000 350   Net -1640 -230          PHYSICAL EXAMINATION: General: chronically ill, WD male in nad Neuro: alert and oriented, MAE HEENT: no purulence or d/c noted, mm pink/moist Cardiovascular: s1s2 rrr, no m/r/g Lungs: +subq air, decreased bs, few faint wheezes on L  Abdomen: soft, non tender Musculoskeletal: no edema  or cyanosis  LABS:  CBC  Recent Labs Lab 12/25/14 0313 12/28/14 0457  WBC 6.6 7.0  HGB 11.3* 11.5*  HCT 38.0* 38.8*  PLT 187 201   BMET  Recent Labs Lab 12/25/14 0313 12/28/14 0457  NA 142 142  K 4.2 3.5  CL 96 93*  CO2 40* 46*  BUN 14 8  CREATININE 0.49* 0.50  GLUCOSE 116* 90   Electrolytes  Recent Labs Lab 12/25/14 0313 12/28/14 0457  CALCIUM 8.3* 8.3*    Imaging Dg Chest 2 View  12/29/2014   CLINICAL DATA:  History of pneumothorax, shortness of breath, followup  EXAM: CHEST  2 VIEW  COMPARISON:  Chest x-ray of 12/28/2014 and CT chest of 12/27/2014  FINDINGS: There is little apparent change in the volume of the small right hydro pneumothorax with right chest tube remaining. The left lung is clear and hyperaerated. Heart size is stable. Bilateral subcutaneous emphysema remains. A catheter overlies the right upper lung field which could represent a central venous line. Correlate clinically.  IMPRESSION: 1. No apparent change in the small right hydro pneumothorax with right chest tube remaining. 2. No change in diffuse subcutaneous emphysema.   Electronically Signed   By: Dwyane DeePaul  Barry M.D.   On: 12/29/2014 07:29   Dg Chest 2 View  12/28/2014   CLINICAL DATA:  Chest pain, chest tube  EXAM: CHEST  2  VIEW  COMPARISON:  Portable chest x-ray of 12/28/2014  FINDINGS: There is a small right hydro ninth pneumothorax remaining. Right chest to is unchanged in position although the side-hole appears to be outside of the pleural space. Bilateral chest wall subcutaneous air is noted extending into the soft tissues of the neck. Heart size is stable.  IMPRESSION: 1. Small right hydro pneumothorax. Side hole of right chest to appears to outside of the pleural space. 2. Subcutaneous emphysema diffusely.   Electronically Signed   By: Dwyane Dee M.D.   On: 12/28/2014 13:59   Ct Chest Wo Contrast  12/27/2014   CLINICAL DATA:  Subsequent evaluation of a 69 year old male with COPD 8 recently  presented with acute shortness of breath, found to have a large pneumothorax on the right side. Status post chest tube placement.  EXAM: CT CHEST WITHOUT CONTRAST  TECHNIQUE: Multidetector CT imaging of the chest was performed following the standard protocol without IV contrast.  COMPARISON:  Chest x-ray 12/27/2014.  FINDINGS: Mediastinum/Lymph Nodes: Heart size is normal. There is no significant pericardial fluid, thickening or pericardial calcification. Severe calcifications of the aortic valve. There is atherosclerosis of the thoracic aorta, the great vessels of the mediastinum and the coronary arteries, including calcified atherosclerotic plaque in the left main, left anterior descending, left circumflex and right coronary arteries. No pathologically enlarged mediastinal or hilar lymph nodes. Please note that accurate exclusion of hilar adenopathy is limited on noncontrast CT scans. Esophagus is unremarkable in appearance. Pneumomediastinum in the upper mediastinum, likely tracking caudally from the lower cervical regions. No axillary lymphadenopathy.  Lungs/Pleura: Right-sided chest tube in position lead tip in the lateral aspect of the right hemithorax at the level of the carina. Side-port appears to be within the right hemithorax demonstrated on image 29 of series 205. Moderate right-sided hydropneumothorax with moderate volume of gas and small volume of fluid in the right pleural space. Dependent passive atelectasis is noted in the lower lobes of the lungs bilaterally. Moderate centrilobular and paraseptal emphysema with numerous bilateral upper lung subpleural bullae and blebs. Small calcified granuloma in the lingula. Patchy areas of bronchial wall thickening, thickening of the peribronchovascular interstitium and peribronchovascular micronodularity, most evident in the left lower lobe and periphery of the right upper lobe, favored to reflect areas of mucoid impaction within terminal bronchioles. No larger  more suspicious appearing pulmonary nodules or masses are otherwise noted.  Upper Abdomen: Multiple low-attenuation lesions in the visualized portions of the kidneys bilaterally, favored predominantly to reflect simple cysts, however, these are incompletely characterized on today's non contrast CT examination. The largest of these lesions measures 4.2 cm extending exophytically from the anterior aspect of the upper pole left kidney. This lesion in particular appears to have a tiny focus of peripheral calcification (image 68 series 201) which is likely mural. Multiple small calcified granulomas in the liver and spleen. Numerous calcified granulomas in the liver and spleen. Several small low-attenuation lesions in the liver are incompletely characterized on today's noncontrast CT examination. The largest of these measures only 1.3 cm in segment 8; these are favored to represent small cysts.  Musculoskeletal/Soft Tissues: There are no aggressive appearing lytic or blastic lesions noted in the visualized portions of the skeleton. Extensive subcutaneous emphysema is noted throughout the chest wall bilaterally (right greater than left), tracking cephalad into the cervical regions bilaterally, and tracking caudally into the right anterior abdominal wall and right posterior flank, with some extension across midline posteriorly.  IMPRESSION: 1. Right-sided chest tube  in position with moderate right hydropneumothorax and a large volume of subcutaneous emphysema in the chest wall, as above. 2. Moderate centrilobular and paraseptal emphysema, most severe in the lung apices. 3. Dependent passive subsegmental atelectasis in the lower lobes of the lungs bilaterally. 4. Atherosclerosis, including left main and 3 vessel coronary artery disease. Please note that although the presence of coronary artery calcium documents the presence of coronary artery disease, the severity of this disease and any potential stenosis cannot be assessed  on this non-gated CT examination. Assessment for potential risk factor modification, dietary therapy or pharmacologic therapy may be warranted, if clinically indicated. 5. There are calcifications of the aortic valve. Echocardiographic correlation for evaluation of potential valvular dysfunction may be warranted if clinically indicated. 6. Multiple renal lesions bilaterally, favored to predominantly reflect cysts. There is one 4.2 cm lesion in the anterior aspect of the upper pole of the left kidney which has a small amount of calcification in the wall (likely a Bosniak class IIF lesion). Further evaluation with nonemergent MRI of the abdomen with and without IV gadolinium in 6 months is recommended to better characterize this finding. This recommendation follows ACR consensus guidelines: Managing Incidental Findings on Abdominal CT: White Paper of the ACR Incidental Findings Committee. J Am Coll Radiol 2010;7:754-773.   Electronically Signed   By: Trudie Reed M.D.   On: 12/27/2014 14:59   Dg Chest Port 1 View  12/28/2014   CLINICAL DATA:  Pneumothorax.  EXAM: PORTABLE CHEST - 1 VIEW  COMPARISON:  December 27, 2014.  FINDINGS: Right-sided chest tube is unchanged in position with side hole barely within the thoracic space. Continued presence of extensive subcutaneous emphysema is seen bilaterally. Mild pneumomediastinum is noted superiorly. Possible pneumothorax is noted laterally on the right, although this may represent overlying soft tissue artifact. Due to the extensive amount of subcutaneous emphysema, it is difficult to evaluate for pneumothorax. Stable cardiomediastinal silhouette. No significant pleural effusion is noted.  IMPRESSION: There remains extensive amount of subcutaneous emphysema bilaterally which may obscure potential pneumothorax. There is the suggestion of a mild right pneumothorax laterally, but this may simply represent overlying soft tissue artifact. Right-sided chest tube is unchanged  in position. Mild pneumomediastinum is noted superiorly.   Electronically Signed   By: Lupita Raider, M.D.   On: 12/28/2014 08:07    ASSESSMENT / PLAN:  Spontaneous R Pneumothorax with Subcutaneous Emphysema > no air leak after 24 hours suction 3/1.  No leak on 3/2.  Hx of GOLD D COPD with emphysema Acute on chronic hypoxic/hypercapnic respiratory failure Protein calorie malnutrition Physical Deconditioning Kidney cysts noted, radiology recommends MRI in 6 months  Plan:   -changed chest tube back to water seal 3/1, CXR in AM -consider TCTS evaluation if persistent air leak -ambulate -Continue O2 at 3L (baseline requirement)  -Continue symbicort, spiriva -PRN Duoneb -MRI kidney 6 months -will need outpatient follow up at the [facility] upon discharge   Canary Brim, NP-C [facility] Pgr: [phone] or [phone]    Attending:  I have seen and examined the patient with nurse practitioner/resident and agree with the note above.   Failed water seal again.  TCTS consult Continue current management  Heber [place], MD [facility] Pager: [phone] Cell: [phone] If no response, call [phone]

## 2014-12-30 ENCOUNTER — Inpatient Hospital Stay (HOSPITAL_COMMUNITY): Payer: Medicare Other

## 2014-12-30 DIAGNOSIS — Z01810 Encounter for preprocedural cardiovascular examination: Secondary | ICD-10-CM

## 2014-12-30 DIAGNOSIS — J9311 Primary spontaneous pneumothorax: Secondary | ICD-10-CM

## 2014-12-30 LAB — CBC
HCT: 40.8 % (ref 39.0–52.0)
HEMATOCRIT: 38 % — AB (ref 39.0–52.0)
HEMOGLOBIN: 11.3 g/dL — AB (ref 13.0–17.0)
Hemoglobin: 12.2 g/dL — ABNORMAL LOW (ref 13.0–17.0)
MCH: 28.9 pg (ref 26.0–34.0)
MCH: 29.5 pg (ref 26.0–34.0)
MCHC: 29.7 g/dL — ABNORMAL LOW (ref 30.0–36.0)
MCHC: 29.9 g/dL — ABNORMAL LOW (ref 30.0–36.0)
MCV: 97.2 fL (ref 78.0–100.0)
MCV: 98.6 fL (ref 78.0–100.0)
PLATELETS: 205 10*3/uL (ref 150–400)
Platelets: 208 10*3/uL (ref 150–400)
RBC: 3.91 MIL/uL — AB (ref 4.22–5.81)
RBC: 4.14 MIL/uL — ABNORMAL LOW (ref 4.22–5.81)
RDW: 15 % (ref 11.5–15.5)
RDW: 15 % (ref 11.5–15.5)
WBC: 7.2 10*3/uL (ref 4.0–10.5)
WBC: 7.1 10*3/uL (ref 4.0–10.5)

## 2014-12-30 LAB — BLOOD GAS, ARTERIAL
Acid-Base Excess: 15 mmol/L — ABNORMAL HIGH (ref 0.0–2.0)
Bicarbonate: 41.7 mEq/L — ABNORMAL HIGH (ref 20.0–24.0)
Drawn by: 39899
O2 Content: 3 L/min
O2 Saturation: 96.4 %
Patient temperature: 98.6
TCO2: 44.2 mmol/L (ref 0–100)
pCO2 arterial: 83.6 mmHg (ref 35.0–45.0)
pH, Arterial: 7.318 — ABNORMAL LOW (ref 7.350–7.450)
pO2, Arterial: 90.6 mmHg (ref 80.0–100.0)

## 2014-12-30 LAB — BASIC METABOLIC PANEL
ANION GAP: 7 (ref 5–15)
BUN: 11 mg/dL (ref 6–23)
CALCIUM: 9 mg/dL (ref 8.4–10.5)
CO2: 43 mmol/L (ref 19–32)
Chloride: 92 mmol/L — ABNORMAL LOW (ref 96–112)
Creatinine, Ser: 0.54 mg/dL (ref 0.50–1.35)
GFR calc Af Amer: >90 mL/min (ref >90)
Glucose, Bld: 104 mg/dL — ABNORMAL HIGH (ref 70–99)
Potassium: 4.3 mmol/L (ref 3.5–5.1)
SODIUM: 142 mmol/L (ref 135–145)

## 2014-12-30 LAB — SURGICAL PCR SCREEN
MRSA, PCR: NEGATIVE
Staphylococcus aureus: POSITIVE — AB

## 2014-12-30 LAB — TYPE AND SCREEN
ABO/RH(D): O POS
Antibody Screen: NEGATIVE

## 2014-12-30 LAB — APTT: aPTT: 33 seconds (ref 24–37)

## 2014-12-30 LAB — ABO/RH: ABO/RH(D): O POS

## 2014-12-30 MED ORDER — CETYLPYRIDINIUM CHLORIDE 0.05 % MT LIQD
7.0000 mL | Freq: Two times a day (BID) | OROMUCOSAL | Status: DC
  Administered 2014-12-31 – 2015-01-07 (×9): 7 mL via OROMUCOSAL

## 2014-12-30 MED ORDER — ENSURE COMPLETE PO LIQD
237.0000 mL | Freq: Three times a day (TID) | ORAL | Status: DC
  Administered 2014-12-30: 237 mL via ORAL

## 2014-12-30 MED ORDER — DEXTROSE 5 % IV SOLN
1.5000 g | INTRAVENOUS | Status: DC
  Filled 2014-12-30 (×2): qty 1.5

## 2014-12-30 NOTE — Progress Notes (Signed)
Critical Lab:   CO2 = 43.  Dr. aware

## 2014-12-30 NOTE — Progress Notes (Signed)
Called Dr. Kendrick FriesMcQuaid made him aware of Co2 from ABG 83.6. No new orders. Monitoring will continue.

## 2014-12-30 NOTE — Progress Notes (Signed)
PULMONARY / CRITICAL CARE MEDICINE    Name: Roger Mathews MRN: [id number] DOB: September 11, 1946    ADMISSION DATE:  12/18/2014  PRIMARY SERVICE: PCCM  CHIEF COMPLAINT:  SOB  BRIEF PATIENT DESCRIPTION:  69 y/o male smoker [facility] with dyspnea from Rt pneumothorax.  He has hx of GOLD D COPD with emphysema and was enrolled in hospice.  SIGNIFICANT EVENTS: 2/20  [facility]  Change to -20 suction on chest tube 2/25  Change to -10 suction on chest tube 2/27  CT to water seal 2/28  Small air leak on water seal, cxr with no definite PTX 2/29  Persistent air leak > back to suction 3/01  CT to water seal  STUDIES:  2/29 CT chest > R hydropneumothorax, moderate emphysema, dependent atelectasis, atherosclerosis or coronary arteries, aortic valve calcification, multiple renal lesions bilaterally, likely cysts, one 4.2cm lesion left kidney with calcification> would evaluate with MRI in 6 months  SUBJECTIVE:  Resting comfortably.  Still with small air leak.     VITAL SIGNS: Temp:  [97.6 F (36.4 C)-98.7 F (37.1 C)] 98 F (36.7 C) (03/03 0346) Pulse Rate:  [89-108] 89 (03/03 0346) Resp:  [18] 18 (03/03 0346) BP: (103-110)/(65-73) 103/65 mmHg (03/03 0346) SpO2:  [95 %-97 %] 95 % (03/03 0346) Weight:  [162 lb 0.6 oz (73.5 kg)] 162 lb 0.6 oz (73.5 kg) (03/03 0346)   INTAKE / OUTPUT: Intake/Output      03/02 0701 - 03/03 0700 03/03 0701 - 03/04 0700   P.O. 840 240   Total Intake(mL/kg) 840 (11.4) 240 (3.3)   Urine (mL/kg/hr) 2250 (1.3) 200 (1.1)   Stool 0 (0)    Chest Tube     Total Output 2250 200   Net -1410 +40        Stool Occurrence 1 x      PHYSICAL EXAMINATION: General: chronically ill, WD male in nad in bed  Neuro: alert and oriented, MAE HEENT: no purulence or d/c noted, mm pink/moist Cardiovascular: s1s2 rrr, no m/r/g Lungs: +subq air, resps even non labored with decreased bs, few faint wheezes on L  Abdomen: soft, non  tender Musculoskeletal: no edema or cyanosis  LABS:  CBC  Recent Labs Lab 12/25/14 0313 12/28/14 0457 12/30/14 0350  WBC 6.6 7.0 7.2  HGB 11.3* 11.5* 11.3*  HCT 38.0* 38.8* 38.0*  PLT 187 201 205   BMET  Recent Labs Lab 12/25/14 0313 12/28/14 0457 12/30/14 0350  NA 142 142 142  K 4.2 3.5 4.3  CL 96 93* 92*  CO2 40* 46* 43*  BUN 14 8 11   CREATININE 0.49* 0.50 0.54  GLUCOSE 116* 90 104*   Electrolytes  Recent Labs Lab 12/25/14 0313 12/28/14 0457 12/30/14 0350  CALCIUM 8.3* 8.3* 9.0    Imaging Dg Chest 2 View  12/29/2014   CLINICAL DATA:  History of pneumothorax, shortness of breath, followup  EXAM: CHEST  2 VIEW  COMPARISON:  Chest x-ray of 12/28/2014 and CT chest of 12/27/2014  FINDINGS: There is little apparent change in the volume of the small right hydro pneumothorax with right chest tube remaining. The left lung is clear and hyperaerated. Heart size is stable. Bilateral subcutaneous emphysema remains. A catheter overlies the right upper lung field which could represent a central venous line. Correlate clinically.  IMPRESSION: 1. No apparent change in the small right hydro pneumothorax with right chest tube remaining. 2. No change in diffuse subcutaneous emphysema.   Electronically  Signed   By: Dwyane Dee M.D.   On: 12/29/2014 07:29   Dg Chest 2 View  12/28/2014   CLINICAL DATA:  Chest pain, chest tube  EXAM: CHEST  2 VIEW  COMPARISON:  Portable chest x-ray of 12/28/2014  FINDINGS: There is a small right hydro ninth pneumothorax remaining. Right chest to is unchanged in position although the side-hole appears to be outside of the pleural space. Bilateral chest wall subcutaneous air is noted extending into the soft tissues of the neck. Heart size is stable.  IMPRESSION: 1. Small right hydro pneumothorax. Side hole of right chest to appears to outside of the pleural space. 2. Subcutaneous emphysema diffusely.   Electronically Signed   By: Dwyane Dee M.D.   On:  12/28/2014 13:59   Dg Chest Port 1 View  12/30/2014   CLINICAL DATA:  Assess chest tube positioning  EXAM: PORTABLE CHEST - 1 VIEW  COMPARISON:  PA and lateral chest x-ray of December 29, 2014  FINDINGS: The lungs are hyperinflated. A large amount of subcutaneous emphysema is present bilaterally. A tiny pleural line remains visible along the right lower lateral thoracic wall but the volume of the pneumothorax has decreased. The chest tube tip lies just above the posterior lateral aspect of the right seventh rib. The proximal port likely lies just inside the pleural space. The cardiac silhouette and pulmonary vascularity are normal.  IMPRESSION: Further interval decrease in the volume of the right-sided pneumothorax. The proximal port of the chest tube may lie just outside the pleural space but it has not significantly changed in positioning since yesterday's study. The left lung is clear.   Electronically Signed   By: David  [place]   On: 12/30/2014 07:46    ASSESSMENT / PLAN:  Spontaneous R Pneumothorax with Subcutaneous Emphysema >persistent air leak.  Failed water seal x 2.  Hx of GOLD D COPD with emphysema Acute on chronic hypoxic/hypercapnic respiratory failure Protein calorie malnutrition Physical Deconditioning Kidney cysts noted, radiology recommends MRI in 6 months  Plan:   -changed chest tube back to water seal 3/1, CXR in AM -will call CVTS for eval with persistent air leak -- Dr. Tyrone Sage to see this am  -ambulate -Continue O2 at 3L (baseline requirement)  -Continue symbicort, spiriva -PRN Duoneb -MRI kidney 6 months -will need outpatient follow up at the [facility] upon discharge    Dirk Dress, NP 12/30/2014  9:29 AM Pager: [phone] or [phone]  Attending:  I have seen and examined the patient with nurse practitioner/resident and agree with the note above.   Roger Mathews was seen by Thoracic surgery this morning and they feel that he needs a bleb resection tomorrow by  VATS.    Lung exam with diminished breath sounds bilaterally today  No air leak on my exam today  Plan: Continue chest tube VATS tomorrow Echo pre-op per TCTS  Heber Diggins, MD [place] [facility] Pager: [phone] Cell: [phone] If no response, call [phone]

## 2014-12-30 NOTE — Consult Note (Signed)
[address]       [place] [id number]             [phone]        Baird LyonsWayland Arras [facility] Medical Record #[id number] Date of Birth: 1945/11/13  Referring: No ref. provider found Primary Care: PROVIDER NOT IN SYSTEM  Chief Complaint: Chief complaint, right spontaneous pneumothorax    History of Present Illness:     Patient examined, chest x-ray and chest CT scans personally reviewed, [facility] reviewed with care everywhere   I was asked to evaluate this 69 year old Caucasian male smoker with severe COPD on home oxygen for potential surgical therapy of a right spontaneous pneumothorax with prolonged air leak after chest tube placement almost 2 weeks ago. The patient is on home oxygen and developed sudden shortness of breath in [place]. He is [facility] to [facility] department [facility] and a chest tube was placed. He was transferred to [place] directly. He [facility].  Since admission here his chest tube has been placed to varying degrees of suction/water seal without resolution of a large airleak and with persistence of a small right pneumothorax. The chest tube appears to be in a appropriate location.  Review of the CT scan of chest shows several significant right apical blebs. There are also some blebs on the lower lobe. There is no evidence of mass or tumor. The patient has significant calcification of his aortic valve and coronary arteries. There is a residual mild-moderate pneumothorax seen on the CT scan when the patient was placed to water seal.  The patient was previously hospitalized January 2015 in the [facility] for acute COPD flare up with associated non-ST elevation MI and SVT-treated conservatively and with a course of amiodarone. He is currently in sinus rhythm.  The patient  denies previous spontaneous pneumothorax. He denies any recent or remote history of trauma to his chest, rib fracture, or thoracic surgery. No previous chest tubes.  The patient has been on home oxygen 3 L for 2-3 years. He lives with his wife.  Current Activity/ Functional Status: Unable to tolerate hardly  any activity on home oxygen. Able to ambulate in the house 1 room at a time   Zubrod Score: At the time of surgery this patient's most appropriate activity status/level should be described as: []     0    Normal activity, no symptoms []     1    Restricted in physical strenuous activity but ambulatory, able to do out light work []     2    Ambulatory and capable of self care, unable to do work activities, up and about                 more than 50%  Of the time                            []     3    Only limited self care, in bed greater than 50% of waking hours [x]     4    Completely disabled, no self care, confined to bed or chair []     5    Moribund  Past Medical History  Diagnosis Date  . COPD (chronic obstructive pulmonary disease)   . Anxiety   .  Depression   . HTN (hypertension)   . Atrial fibrillation     Past Surgical History  Procedure Laterality Date  . Tonsilectomy/adenoidectomy with myringotomy      History  Smoking status  . Current Every Day Smoker -- 2.00 packs/day for 52 years  . Types: Cigarettes  . Start date: 12/19/1961  Smokeless tobacco  . Not on file    History  Alcohol Use: Not on file    History   Social History  . Marital Status: Married    Spouse Name: N/A  . Number of Children: N/A  . Years of Education: N/A   Occupational History  . Not on file.   Social History Main Topics  . Smoking status: Current Every Day Smoker -- 2.00 packs/day for 52 years    Types: Cigarettes    Start date: 12/19/1961  . Smokeless tobacco: Not on file  . Alcohol Use: Not on file  . Drug Use: Not on file  . Sexual Activity: Not on file   Other Topics  Concern  . Not on file   Social History Narrative  . No narrative on file    Allergies  Allergen Reactions  . Penicillins Other (See Comments)    Unknown childhood allergic reaction    Current Facility-Administered Medications  Medication Dose Route Frequency Provider Last Rate Last Dose  . 0.9 %  sodium chloride infusion  250 mL Intravenous PRN Jenelle Mages, MD      . acetaminophen (TYLENOL) tablet 650 mg  650 mg Oral Q6H PRN Henry Russel, MD   650 mg at 12/29/14 0129  . antiseptic oral rinse (CPC / CETYLPYRIDINIUM CHLORIDE 0.05%) solution 7 mL  7 mL Mouth Rinse BID Nelda Bucks, MD      . aspirin chewable tablet 81 mg  81 mg Oral Daily Jenelle Mages, MD   81 mg at 12/29/14 1120  . budesonide-formoterol (SYMBICORT) 160-4.5 MCG/ACT inhaler 2 puff  2 puff Inhalation BID Jenelle Mages, MD   2 puff at 12/30/14 0947  . busPIRone (BUSPAR) tablet 10 mg  10 mg Oral TID Jenelle Mages, MD   10 mg at 12/30/14 0959  . feeding supplement (ENSURE COMPLETE) (ENSURE COMPLETE) liquid 237 mL  237 mL Oral TID WC Kerin Perna, MD      . heparin injection 5,000 Units  5,000 Units Subcutaneous 3 times per day Jenelle Mages, MD   5,000 Units at 12/30/14 0600  . ipratropium-albuterol (DUONEB) 0.5-2.5 (3) MG/3ML nebulizer solution 3 mL  3 mL Nebulization Q6H PRN Jenelle Mages, MD   3 mL at 12/29/14 2122  . menthol-cetylpyridinium (CEPACOL) lozenge 3 mg  1 lozenge Oral PRN Nyoka Cowden, MD   3 mg at 12/23/14 2242  . metoprolol tartrate (LOPRESSOR) tablet 12.5 mg  12.5 mg Oral BID Jenelle Mages, MD   12.5 mg at 12/30/14 0959  . mirtazapine (REMERON) tablet 7.5 mg  7.5 mg Oral QHS Jenelle Mages, MD   7.5 mg at 12/29/14 2235  . multivitamin with minerals tablet 1 tablet  1 tablet Oral Daily Jenelle Mages, MD   1 tablet at 12/30/14 0959  . nicotine (NICODERM CQ - dosed in mg/24 hours) patch 14 mg  14 mg Transdermal Daily Jenelle Mages, MD   14 mg at 12/19/14 1007  .  pantoprazole (PROTONIX) EC tablet 40 mg  40 mg Oral Daily Nyoka Cowden, MD   40 mg at 12/30/14 0959  .  pravastatin (PRAVACHOL) tablet 80 mg  80 mg Oral Daily Jenelle Mages, MD   80 mg at 12/30/14 1001  . sertraline (ZOLOFT) tablet 100 mg  100 mg Oral Daily Jenelle Mages, MD   100 mg at 12/30/14 1001  . tiotropium (SPIRIVA) inhalation capsule 18 mcg  18 mcg Inhalation Daily Jenelle Mages, MD   18 mcg at 12/30/14 4098    Prescriptions prior to admission  Medication Sig Dispense Refill Last Dose  . albuterol (PROVENTIL) (2.5 MG/3ML) 0.083% nebulizer solution Take 2.5 mg by nebulization every 6 (six) hours as needed for wheezing or shortness of breath.   past 2 weeks at Unknown time  . aspirin 81 MG tablet Take 81 mg by mouth daily.   past 2 weeks at Unknown time  . budesonide-formoterol (SYMBICORT) 160-4.5 MCG/ACT inhaler Inhale 2 puffs into the lungs 2 (two) times daily.   past 2 weeks at Unknown time  . busPIRone (BUSPAR) 10 MG tablet Take 10 mg by mouth 3 (three) times daily.   past 2 weeks at Unknown time  . metoprolol tartrate (LOPRESSOR) 25 MG tablet Take 12.5 mg by mouth 2 (two) times daily.   past 2 weeks at 2200  . mirtazapine (REMERON) 7.5 MG tablet Take 7.5 mg by mouth at bedtime.   past 2 weeks at Unknown time  . Multiple Vitamins-Minerals (MULTIVITAMIN WITH MINERALS) tablet Take 1 tablet by mouth daily.   past 2 weeks at Unknown time  . pravastatin (PRAVACHOL) 80 MG tablet Take 80 mg by mouth daily.   past 2 weeks at Unknown time  . sertraline (ZOLOFT) 100 MG tablet Take 150 mg by mouth daily.    past 2 weeks at Unknown time  . tiotropium (SPIRIVA) 18 MCG inhalation capsule Place 18 mcg into inhaler and inhale daily.   past 2 weeks at Unknown time    History reviewed. No pertinent family history. Patient is adopted  Review of Systems:  Review of systems    Cardiac Review of Systems: Y or N  Chest Pain [ no   ]  Resting SOB [ yes  ] Exertional SOB  [ yes ]  Diego Cory  ]   Pedal Edema [  no ]    Palpitations Mahler.Beck  ] Syncope  [ no ]   Presyncope [  no ]  General Review of Systems: [Y] = yes [  ]=no Constitional: recent weight change [ no ]; anorexia [  ]; fatigue [  ]; nausea [  ]; night sweats [  ]; fever [  ]; or chills [  ]                                                               Dental: poor dentition[yes  ]; Last Dentist visit: Greater than one year  Eye : blurred vision [  ]; diplopia [   ]; vision changes [  ];  Amaurosis fugax[  ]; Resp: cough [  yes];  wheezing[yes  ];  hemoptysis[  ]; shortness of breath[yes  ]; paroxysmal nocturnal dyspnea[  ]; dyspnea on exertion[  ] yes; or orthopnea[  ];  GI:  gallstones[  ], vomiting[  ];  dysphagia[  ]; melena[  ];  hematochezia [  ]; heartburn[  ];  Hx of  Colonoscopy[  ]; GU: kidney stones [  ]; hematuria[  ];   dysuria [  ];  nocturia[  ];  history of     obstruction [  ]; urinary frequency [  ] CT scan shows a probable renal cyst-further evaluation with MRI recommended             Skin: rash, swelling[  ];, hair loss[  ];  peripheral edema[  ];  or itching[  ]; Musculosketetal: myalgias[  ];  joint swelling[  ];  joint erythema[  ];  joint pain[  ];  back pain[  ];  Heme/Lymph: bruising[  ];  bleeding[  ];  anemia[  ];  Neuro: TIA[  ];  headaches[  ];  stroke[  ];  vertigo[  ];  seizures[  ];   paresthesias[  ];  difficulty walking[yes  ];  Psych:depression[  ]; anxiety[  ];  Endocrine: diabetes[no  ];  thyroid dysfunction[  ];  Immunizations: Flu [  ]; Pneumococcal[  ];  Other:  Physical Exam: BP 103/66 mmHg  Pulse 103  Temp(Src) 98 F (36.7 C) (Oral)  Resp 18  Ht  (1.778 m)  Wt 162 lb 0.6 oz (73.5 kg)  BMI 23.25 kg/m2  SpO2 96%    General: Frail middle-aged Caucasian male with severe COPD on oxygen with a right chest tube connected to Pleur-evac HEENT: Normocephalic pupils equal , dentition with necrotic as well as missing teeth Neck: Supple without JVD, adenopathy, or  bruit Chest: Distant breath sounds, scattered wheezes, poor ventilatory mechanics. Large airleak with deep inspiration or cough through chest tube., No tenderness around chest tube exit site              Cardiovascular: Regular rate and rhythm, no murmur, no gallop, peripheral pulses             palpable in all extremities Abdomen:  Soft, nontender, no palpable mass or organomegaly Extremities: Warm, well-perfused, positive clubbing, no cyanosis edema or tenderness,              no venous stasis changes of the legs Rectal/GU: Deferred Neuro: Grossly non--focal and symmetrical throughout Skin: Clean and dry without rash or ulceration   Diagnostic Studies & Laboratory data:     Recent Radiology Findings:   Dg Chest 2 View  12/29/2014   CLINICAL DATA:  History of pneumothorax, shortness of breath, followup  EXAM: CHEST  2 VIEW  COMPARISON:  Chest x-ray of 12/28/2014 and CT chest of 12/27/2014  FINDINGS: There is little apparent change in the volume of the small right hydro pneumothorax with right chest tube remaining. The left lung is clear and hyperaerated. Heart size is stable. Bilateral subcutaneous emphysema remains. A catheter overlies the right upper lung field which could represent a central venous line. Correlate clinically.  IMPRESSION: 1. No apparent change in the small right hydro pneumothorax with right chest tube remaining. 2. No change in diffuse subcutaneous emphysema.   Electronically Signed   By: Dwyane Dee M.D.   On: 12/29/2014 07:29   Dg Chest 2 View  12/28/2014   CLINICAL DATA:  Chest pain, chest tube  EXAM: CHEST  2 VIEW  COMPARISON:  Portable chest x-ray of 12/28/2014  FINDINGS: There is a small right hydro ninth pneumothorax remaining. Right chest to is unchanged in position although the side-hole appears to be outside of the pleural space. Bilateral chest wall subcutaneous air is noted extending into the soft tissues of  the neck. Heart size is stable.  IMPRESSION: 1. Small  right hydro pneumothorax. Side hole of right chest to appears to outside of the pleural space. 2. Subcutaneous emphysema diffusely.   Electronically Signed   By: Dwyane Dee M.D.   On: 12/28/2014 13:59   Dg Chest Port 1 View  12/30/2014   CLINICAL DATA:  Assess chest tube positioning  EXAM: PORTABLE CHEST - 1 VIEW  COMPARISON:  PA and lateral chest x-ray of December 29, 2014  FINDINGS: The lungs are hyperinflated. A large amount of subcutaneous emphysema is present bilaterally. A tiny pleural line remains visible along the right lower lateral thoracic wall but the volume of the pneumothorax has decreased. The chest tube tip lies just above the posterior lateral aspect of the right seventh rib. The proximal port likely lies just inside the pleural space. The cardiac silhouette and pulmonary vascularity are normal.  IMPRESSION: Further interval decrease in the volume of the right-sided pneumothorax. The proximal port of the chest tube may lie just outside the pleural space but it has not significantly changed in positioning since yesterday's study. The left lung is clear.   Electronically Signed   By: David  [place]   On: 12/30/2014 07:46     I have independently reviewed the above radiologic studies. The patient has severe COPD, adequately placed right chest tube, and persistent mild-moderate right pneumothorax, no evidence of lung cancer.  Recent Lab Findings: Lab Results  Component Value Date   WBC 7.2 12/30/2014   HGB 11.3* 12/30/2014   HCT 38.0* 12/30/2014   PLT 205 12/30/2014   GLUCOSE 104* 12/30/2014   ALT 18 12/20/2014   AST 19 12/20/2014   NA 142 12/30/2014   K 4.3 12/30/2014   CL 92* 12/30/2014   CREATININE 0.54 12/30/2014   BUN 11 12/30/2014   CO2 43* 12/30/2014   HGBA1C 5.8* 12/19/2014      Assessment / Plan:     Persistent large air leak 2 weeks after placement of chest tube for right spontaneous pneumothorax. I doubt that the air leak will resolve without surgery. Right VATS for  bleb resection-closure of air leak will be at increased risk due to his underlying COPD. Sending the patient home with a chest tube would probably not benefit the patient. We'll plan on right VATS for bleb resection tomorrow. We'll obtain echocardiogram first because of his history of a NSTEMI one year ago and calcification of the aortic valve on CT scan. The patient understands the procedure will be at high risk and there is possibility he could require mechanical ventilation following surgery. He understands that he will be a full code to allow a reasonable chance  for postoperative recovery.    I spent 45 minutes counseling the patient face to face and 50% or more the  time was spent in counseling and coordination of care. The total time spent in the appointment was 75 minutes  @ME1 @ 12/30/2014 10:06 AM

## 2014-12-31 ENCOUNTER — Inpatient Hospital Stay (HOSPITAL_COMMUNITY): Payer: Medicare Other | Admitting: Anesthesiology

## 2014-12-31 ENCOUNTER — Inpatient Hospital Stay (HOSPITAL_COMMUNITY): Payer: Medicare Other

## 2014-12-31 ENCOUNTER — Encounter (HOSPITAL_COMMUNITY)
Admission: AD | Disposition: A | Discharge status: Hospice | Payer: Self-pay | Source: Other Acute Inpatient Hospital | Attending: Internal Medicine

## 2014-12-31 DIAGNOSIS — J441 Chronic obstructive pulmonary disease with (acute) exacerbation: Secondary | ICD-10-CM | POA: Diagnosis present

## 2014-12-31 HISTORY — PX: STAPLING OF BLEBS: SHX6429

## 2014-12-31 HISTORY — PX: VIDEO ASSISTED THORACOSCOPY: SHX5073

## 2014-12-31 LAB — BASIC METABOLIC PANEL
Anion gap: 7 (ref 5–15)
BUN: 10 mg/dL (ref 6–23)
CO2: 32 mmol/L (ref 19–32)
Calcium: 7.5 mg/dL — ABNORMAL LOW (ref 8.4–10.5)
Chloride: 99 mmol/L (ref 96–112)
Creatinine, Ser: 0.46 mg/dL — ABNORMAL LOW (ref 0.50–1.35)
GFR calc Af Amer: >90 mL/min (ref >90)
GFR calc non Af Amer: >90 mL/min (ref >90)
Glucose, Bld: 127 mg/dL — ABNORMAL HIGH (ref 70–99)
Potassium: 3.7 mmol/L (ref 3.5–5.1)
Sodium: 138 mmol/L (ref 135–145)

## 2014-12-31 LAB — POCT I-STAT 3, ART BLOOD GAS (G3+)
ACID-BASE EXCESS: 10 mmol/L — AB (ref 0.0–2.0)
ACID-BASE EXCESS: 7 mmol/L — AB (ref 0.0–2.0)
BICARBONATE: 36.6 meq/L — AB (ref 20.0–24.0)
BICARBONATE: 36.7 meq/L — AB (ref 20.0–24.0)
O2 Saturation: 98 %
O2 Saturation: 87 %
PH ART: 7.425 (ref 7.350–7.450)
Patient temperature: 98.2
TCO2: 38 mmol/L (ref 0–100)
TCO2: 39 mmol/L (ref 0–100)
pCO2 arterial: 55.6 mmHg — ABNORMAL HIGH (ref 35.0–45.0)
pCO2 arterial: 76.5 mmHg (ref 35.0–45.0)
pH, Arterial: 7.289 — ABNORMAL LOW (ref 7.350–7.450)
pO2, Arterial: 98 mmHg (ref 80.0–100.0)
pO2, Arterial: 63 mmHg — ABNORMAL LOW (ref 80.0–100.0)

## 2014-12-31 LAB — PROTIME-INR
INR: 0.92 (ref 0.00–1.49)
Prothrombin Time: 12.4 seconds (ref 11.6–15.2)

## 2014-12-31 LAB — CBC
HCT: 36.7 % — ABNORMAL LOW (ref 39.0–52.0)
Hemoglobin: 11.2 g/dL — ABNORMAL LOW (ref 13.0–17.0)
MCH: 29.2 pg (ref 26.0–34.0)
MCHC: 30.5 g/dL (ref 30.0–36.0)
MCV: 95.8 fL (ref 78.0–100.0)
Platelets: 207 10*3/uL (ref 150–400)
RBC: 3.83 MIL/uL — ABNORMAL LOW (ref 4.22–5.81)
RDW: 15 % (ref 11.5–15.5)
WBC: 12.7 10*3/uL — ABNORMAL HIGH (ref 4.0–10.5)

## 2014-12-31 SURGERY — VIDEO ASSISTED THORACOSCOPY
Anesthesia: General | Site: Chest | Laterality: Right

## 2014-12-31 MED ORDER — ACETAMINOPHEN 160 MG/5ML PO SOLN
1000.0000 mg | Freq: Four times a day (QID) | ORAL | Status: AC
  Filled 2014-12-31: qty 40

## 2014-12-31 MED ORDER — BUDESONIDE 0.5 MG/2ML IN SUSP
0.5000 mg | Freq: Two times a day (BID) | RESPIRATORY_TRACT | Status: DC
  Administered 2014-12-31 – 2015-01-01 (×2): 0.5 mg via RESPIRATORY_TRACT
  Filled 2014-12-31 (×4): qty 2

## 2014-12-31 MED ORDER — TALC 5 G PL SUSR
INTRAPLEURAL | Status: DC | PRN
  Administered 2014-12-31: 5 g via INTRAPLEURAL

## 2014-12-31 MED ORDER — FENTANYL CITRATE 0.05 MG/ML IJ SOLN: 50.0000 ug | INTRAMUSCULAR | Status: DC | PRN

## 2014-12-31 MED ORDER — NEOSTIGMINE METHYLSULFATE 10 MG/10ML IV SOLN
INTRAVENOUS | Status: DC | PRN
Start: 2014-12-31 — End: 2014-12-31
  Administered 2014-12-31: 4 mg via INTRAVENOUS

## 2014-12-31 MED ORDER — SODIUM CHLORIDE 0.9 % IV BOLUS (SEPSIS)
500.0000 mL | Freq: Once | INTRAVENOUS | Status: AC
  Administered 2014-12-31: 500 mL via INTRAVENOUS

## 2014-12-31 MED ORDER — FENTANYL CITRATE 0.05 MG/ML IJ SOLN
50.0000 ug | INTRAMUSCULAR | Status: DC | PRN
  Filled 2014-12-31: qty 2

## 2014-12-31 MED ORDER — ONDANSETRON HCL 4 MG/2ML IJ SOLN: 4.0000 mg | Freq: Four times a day (QID) | INTRAMUSCULAR | Status: DC | PRN

## 2014-12-31 MED ORDER — SENNOSIDES-DOCUSATE SODIUM 8.6-50 MG PO TABS
1.0000 | ORAL_TABLET | Freq: Every day | ORAL | Status: DC
  Administered 2014-12-31 – 2015-01-02 (×3): 1 via ORAL
  Filled 2014-12-31 (×9): qty 1

## 2014-12-31 MED ORDER — MUPIROCIN 2 % EX OINT
1.0000 "application " | TOPICAL_OINTMENT | Freq: Two times a day (BID) | CUTANEOUS | Status: AC
  Administered 2014-12-31 – 2015-01-04 (×6): 1 via NASAL
  Filled 2014-12-31 (×2): qty 22

## 2014-12-31 MED ORDER — PROPOFOL 10 MG/ML IV BOLUS
INTRAVENOUS | Status: DC | PRN
  Administered 2014-12-31: 150 mg via INTRAVENOUS

## 2014-12-31 MED ORDER — FENTANYL CITRATE 0.05 MG/ML IJ SOLN: 12.5000 ug | INTRAMUSCULAR | Status: DC | PRN

## 2014-12-31 MED ORDER — DIPHENHYDRAMINE HCL 50 MG/ML IJ SOLN: 12.5000 mg | Freq: Four times a day (QID) | INTRAMUSCULAR | Status: DC | PRN

## 2014-12-31 MED ORDER — DEXTROSE 5 % IV SOLN
10.0000 mg | INTRAVENOUS | Status: DC | PRN
  Administered 2014-12-31: 20 ug/min via INTRAVENOUS

## 2014-12-31 MED ORDER — NEOSTIGMINE METHYLSULFATE 10 MG/10ML IV SOLN
INTRAVENOUS | Status: AC
  Filled 2014-12-31: qty 1

## 2014-12-31 MED ORDER — ARTIFICIAL TEARS OP OINT
TOPICAL_OINTMENT | OPHTHALMIC | Status: AC
  Filled 2014-12-31: qty 3.5

## 2014-12-31 MED ORDER — LEVALBUTEROL HCL 0.63 MG/3ML IN NEBU: 0.6300 mg | INHALATION_SOLUTION | Freq: Four times a day (QID) | RESPIRATORY_TRACT | Status: DC

## 2014-12-31 MED ORDER — ENOXAPARIN SODIUM 40 MG/0.4ML ~~LOC~~ SOLN
40.0000 mg | Freq: Every day | SUBCUTANEOUS | Status: DC
  Administered 2015-01-01 – 2015-01-07 (×7): 40 mg via SUBCUTANEOUS
  Filled 2014-12-31 (×7): qty 0.4

## 2014-12-31 MED ORDER — DIPHENHYDRAMINE HCL 12.5 MG/5ML PO ELIX
12.5000 mg | ORAL_SOLUTION | Freq: Four times a day (QID) | ORAL | Status: DC | PRN
  Filled 2014-12-31: qty 5

## 2014-12-31 MED ORDER — FENTANYL CITRATE 0.05 MG/ML IJ SOLN
50.0000 ug | INTRAMUSCULAR | Status: DC | PRN
  Administered 2014-12-31: 50 ug via INTRAVENOUS

## 2014-12-31 MED ORDER — BISACODYL 5 MG PO TBEC
10.0000 mg | DELAYED_RELEASE_TABLET | Freq: Every day | ORAL | Status: DC
  Administered 2015-01-01 – 2015-01-03 (×3): 10 mg via ORAL
  Filled 2014-12-31 (×5): qty 2

## 2014-12-31 MED ORDER — PROPOFOL 10 MG/ML IV EMUL
5.0000 ug/kg/min | INTRAVENOUS | Status: DC
  Administered 2014-12-31: 50 ug/kg/min via INTRAVENOUS

## 2014-12-31 MED ORDER — FENTANYL 10 MCG/ML IV SOLN
INTRAVENOUS | Status: DC
  Administered 2014-12-31: 40 ug via INTRAVENOUS
  Administered 2014-12-31: 17:00:00 via INTRAVENOUS
  Administered 2015-01-01: 110 ug via INTRAVENOUS
  Administered 2015-01-01: 20 ug via INTRAVENOUS
  Administered 2015-01-01: 40 ug via INTRAVENOUS
  Administered 2015-01-01: 100 ug via INTRAVENOUS
  Administered 2015-01-01: 90 ug via INTRAVENOUS
  Administered 2015-01-01: 10 ug via INTRAVENOUS
  Administered 2015-01-02: 70 ug via INTRAVENOUS
  Administered 2015-01-02: 06:00:00 via INTRAVENOUS
  Administered 2015-01-02: 20 ug via INTRAVENOUS
  Administered 2015-01-02: 50 ug via INTRAVENOUS
  Administered 2015-01-02: 40 ug via INTRAVENOUS
  Administered 2015-01-02: 10 ug via INTRAVENOUS
  Administered 2015-01-03 (×2): 40 ug via INTRAVENOUS
  Filled 2014-12-31 (×2): qty 50

## 2014-12-31 MED ORDER — PHENYLEPHRINE HCL 10 MG/ML IJ SOLN: 0.0000 ug/min | INTRAMUSCULAR | Status: DC

## 2014-12-31 MED ORDER — ONDANSETRON HCL 4 MG/2ML IJ SOLN
INTRAMUSCULAR | Status: AC
Start: 2014-12-31 — End: 2014-12-31
  Filled 2014-12-31: qty 2

## 2014-12-31 MED ORDER — SODIUM CHLORIDE 0.9 % IR SOLN
Status: DC | PRN
  Administered 2014-12-31: 1000 mL
  Administered 2014-12-31: 2000 mL

## 2014-12-31 MED ORDER — FAMOTIDINE IN NACL 20-0.9 MG/50ML-% IV SOLN
20.0000 mg | Freq: Two times a day (BID) | INTRAVENOUS | Status: DC
  Administered 2014-12-31: 20 mg via INTRAVENOUS
  Filled 2014-12-31: qty 50

## 2014-12-31 MED ORDER — POTASSIUM CHLORIDE IN NACL 20-0.9 MEQ/L-% IV SOLN
INTRAVENOUS | Status: DC
  Administered 2014-12-31 – 2015-01-04 (×3): via INTRAVENOUS
  Filled 2014-12-31 (×6): qty 1000

## 2014-12-31 MED ORDER — MIDAZOLAM HCL 2 MG/2ML IJ SOLN
INTRAMUSCULAR | Status: AC
  Filled 2014-12-31: qty 2

## 2014-12-31 MED ORDER — TALC 5 G PL SUSR
INTRAPLEURAL | Status: AC
  Filled 2014-12-31: qty 5

## 2014-12-31 MED ORDER — NALOXONE HCL 0.4 MG/ML IJ SOLN: 0.4000 mg | INTRAMUSCULAR | Status: DC | PRN

## 2014-12-31 MED ORDER — METHYLPREDNISOLONE SODIUM SUCC 125 MG IJ SOLR
60.0000 mg | Freq: Two times a day (BID) | INTRAMUSCULAR | Status: AC
  Administered 2014-12-31 – 2015-01-01 (×3): 60 mg via INTRAVENOUS
  Filled 2014-12-31: qty 0.96
  Filled 2014-12-31 (×3): qty 2

## 2014-12-31 MED ORDER — ALBUMIN HUMAN 5 % IV SOLN: 12.5000 g | Freq: Once | INTRAVENOUS | Status: DC

## 2014-12-31 MED ORDER — LACTATED RINGERS IV SOLN
INTRAVENOUS | Status: DC | PRN
  Administered 2014-12-31: 07:00:00 via INTRAVENOUS

## 2014-12-31 MED ORDER — CHLORHEXIDINE GLUCONATE 0.12 % MT SOLN
15.0000 mL | Freq: Two times a day (BID) | OROMUCOSAL | Status: DC
  Administered 2014-12-31: 15 mL via OROMUCOSAL
  Filled 2014-12-31: qty 15

## 2014-12-31 MED ORDER — DEXTROSE 5 % IV SOLN
1.5000 g | INTRAVENOUS | Status: DC | PRN
  Administered 2014-12-31: 1.5 g via INTRAVENOUS

## 2014-12-31 MED ORDER — DEXTROSE 5 % IV SOLN
1.5000 g | Freq: Two times a day (BID) | INTRAVENOUS | Status: DC
  Filled 2014-12-31: qty 1.5

## 2014-12-31 MED ORDER — DEXTROSE 5 % IV SOLN
1.5000 g | Freq: Two times a day (BID) | INTRAVENOUS | Status: AC
  Administered 2014-12-31 – 2015-01-02 (×4): 1.5 g via INTRAVENOUS
  Filled 2014-12-31 (×4): qty 1.5

## 2014-12-31 MED ORDER — LABETALOL HCL 5 MG/ML IV SOLN
INTRAVENOUS | Status: DC | PRN
  Administered 2014-12-31: 5 mg via INTRAVENOUS

## 2014-12-31 MED ORDER — ROCURONIUM BROMIDE 50 MG/5ML IV SOLN
INTRAVENOUS | Status: AC
  Filled 2014-12-31: qty 1

## 2014-12-31 MED ORDER — LIDOCAINE HCL (CARDIAC) 20 MG/ML IV SOLN
INTRAVENOUS | Status: AC
  Filled 2014-12-31: qty 5

## 2014-12-31 MED ORDER — ROCURONIUM BROMIDE 100 MG/10ML IV SOLN
INTRAVENOUS | Status: DC | PRN
  Administered 2014-12-31: 10 mg via INTRAVENOUS
  Administered 2014-12-31 (×2): 20 mg via INTRAVENOUS
  Administered 2014-12-31: 30 mg via INTRAVENOUS
  Administered 2014-12-31: 50 mg via INTRAVENOUS
  Administered 2014-12-31: 20 mg via INTRAVENOUS

## 2014-12-31 MED ORDER — DEXTROSE 5 % IV SOLN
0.0000 ug/min | INTRAVENOUS | Status: DC
  Administered 2014-12-31: 30 ug/min via INTRAVENOUS
  Administered 2014-12-31: 20 ug/min via INTRAVENOUS
  Administered 2014-12-31: 30 ug/min via INTRAVENOUS
  Filled 2014-12-31 (×5): qty 1

## 2014-12-31 MED ORDER — FENTANYL CITRATE 0.05 MG/ML IJ SOLN
INTRAMUSCULAR | Status: AC
  Filled 2014-12-31: qty 5

## 2014-12-31 MED ORDER — PROPOFOL 10 MG/ML IV BOLUS
INTRAVENOUS | Status: AC
  Filled 2014-12-31: qty 20

## 2014-12-31 MED ORDER — SUCCINYLCHOLINE CHLORIDE 20 MG/ML IJ SOLN
INTRAMUSCULAR | Status: AC
  Filled 2014-12-31: qty 1

## 2014-12-31 MED ORDER — ARTIFICIAL TEARS OP OINT
TOPICAL_OINTMENT | OPHTHALMIC | Status: DC | PRN
  Administered 2014-12-31: 1 via OPHTHALMIC

## 2014-12-31 MED ORDER — ALBUTEROL SULFATE (2.5 MG/3ML) 0.083% IN NEBU
2.5000 mg | INHALATION_SOLUTION | RESPIRATORY_TRACT | Status: DC | PRN
  Administered 2014-12-31: 2.5 mg via RESPIRATORY_TRACT
  Filled 2014-12-31: qty 3

## 2014-12-31 MED ORDER — FENTANYL CITRATE 0.05 MG/ML IJ SOLN
12.5000 ug | INTRAMUSCULAR | Status: DC | PRN
  Administered 2014-12-31: 25 ug via INTRAVENOUS
  Filled 2014-12-31: qty 2

## 2014-12-31 MED ORDER — GLYCOPYRROLATE 0.2 MG/ML IJ SOLN
INTRAMUSCULAR | Status: AC
  Filled 2014-12-31: qty 1

## 2014-12-31 MED ORDER — MIDAZOLAM HCL 5 MG/5ML IJ SOLN
INTRAMUSCULAR | Status: DC | PRN
  Administered 2014-12-31: 1 mg via INTRAVENOUS

## 2014-12-31 MED ORDER — METHYLPREDNISOLONE SODIUM SUCC 125 MG IJ SOLR
INTRAMUSCULAR | Status: DC | PRN
  Administered 2014-12-31: 80 mg via INTRAVENOUS

## 2014-12-31 MED ORDER — GLYCOPYRROLATE 0.2 MG/ML IJ SOLN
INTRAMUSCULAR | Status: DC | PRN
  Administered 2014-12-31: .5 mg via INTRAVENOUS

## 2014-12-31 MED ORDER — ACETAMINOPHEN 500 MG PO TABS
1000.0000 mg | ORAL_TABLET | Freq: Four times a day (QID) | ORAL | Status: AC
  Administered 2014-12-31 – 2015-01-04 (×14): 1000 mg via ORAL
  Filled 2014-12-31 (×19): qty 2

## 2014-12-31 MED ORDER — POTASSIUM CHLORIDE 10 MEQ/50ML IV SOLN
10.0000 meq | Freq: Every day | INTRAVENOUS | Status: DC | PRN
  Filled 2014-12-31: qty 50

## 2014-12-31 MED ORDER — ARFORMOTEROL TARTRATE 15 MCG/2ML IN NEBU
15.0000 ug | INHALATION_SOLUTION | Freq: Two times a day (BID) | RESPIRATORY_TRACT | Status: DC
  Administered 2014-12-31 – 2015-01-01 (×2): 15 ug via RESPIRATORY_TRACT
  Filled 2014-12-31 (×4): qty 2

## 2014-12-31 MED ORDER — CHLORHEXIDINE GLUCONATE CLOTH 2 % EX PADS
6.0000 | MEDICATED_PAD | Freq: Every day | CUTANEOUS | Status: AC
  Administered 2015-01-02 – 2015-01-04 (×3): 6 via TOPICAL

## 2014-12-31 MED ORDER — FENTANYL CITRATE 0.05 MG/ML IJ SOLN
INTRAMUSCULAR | Status: DC | PRN
  Administered 2014-12-31 (×2): 50 ug via INTRAVENOUS
  Administered 2014-12-31: 100 ug via INTRAVENOUS
  Administered 2014-12-31: 50 ug via INTRAVENOUS

## 2014-12-31 MED ORDER — SODIUM CHLORIDE 0.9 % IJ SOLN: 9.0000 mL | INTRAMUSCULAR | Status: DC | PRN

## 2014-12-31 MED ORDER — PROPOFOL 10 MG/ML IV EMUL
INTRAVENOUS | Status: AC
  Filled 2014-12-31: qty 100

## 2014-12-31 MED ORDER — GLYCOPYRROLATE 0.2 MG/ML IJ SOLN
INTRAMUSCULAR | Status: AC
  Filled 2014-12-31: qty 2

## 2014-12-31 MED ORDER — PANTOPRAZOLE SODIUM 40 MG IV SOLR
40.0000 mg | INTRAVENOUS | Status: DC
  Administered 2014-12-31: 40 mg via INTRAVENOUS
  Filled 2014-12-31 (×2): qty 40

## 2014-12-31 MED ORDER — ONDANSETRON HCL 4 MG/2ML IJ SOLN
INTRAMUSCULAR | Status: DC | PRN
  Administered 2014-12-31: 4 mg via INTRAVENOUS

## 2014-12-31 MED ORDER — METOCLOPRAMIDE HCL 5 MG/ML IJ SOLN
10.0000 mg | Freq: Four times a day (QID) | INTRAMUSCULAR | Status: DC
  Administered 2014-12-31 – 2015-01-03 (×12): 10 mg via INTRAVENOUS
  Filled 2014-12-31 (×17): qty 2

## 2014-12-31 SURGICAL SUPPLY — 78 items
BAG DECANTER FOR FLEXI CONT (MISCELLANEOUS) IMPLANT
BAG ISOLATION DRAPE 18X18 (DRAPES) ×1 IMPLANT
BLADE SURG 11 STRL SS (BLADE) ×3 IMPLANT
CANISTER SUCTION 2500CC (MISCELLANEOUS) ×3 IMPLANT
CATH KIT ON Q 5IN SLV (PAIN MANAGEMENT) IMPLANT
CATH ROBINSON RED A/P 20FR (CATHETERS) ×3 IMPLANT
CATH ROBINSON RED A/P 22FR (CATHETERS) IMPLANT
CATH THORACIC 28FR (CATHETERS) ×3 IMPLANT
CATH THORACIC 36FR (CATHETERS) IMPLANT
CATH THORACIC 36FR RT ANG (CATHETERS) IMPLANT
CLIP TI MEDIUM 6 (CLIP) ×3 IMPLANT
CONT SPEC 4OZ CLIKSEAL STRL BL (MISCELLANEOUS) ×27 IMPLANT
COVER SURGICAL LIGHT HANDLE (MISCELLANEOUS) ×6 IMPLANT
DERMABOND ADVANCED (GAUZE/BANDAGES/DRESSINGS)
DERMABOND ADVANCED .7 DNX12 (GAUZE/BANDAGES/DRESSINGS) IMPLANT
DRAPE ISOLATION BAG 18X18 (DRAPES) ×2
DRAPE LAPAROSCOPIC ABDOMINAL (DRAPES) ×3 IMPLANT
DRAPE WARM FLUID 44X44 (DRAPE) ×3 IMPLANT
DRSG AQUACEL AG ADV 3.5X10 (GAUZE/BANDAGES/DRESSINGS) ×3 IMPLANT
DRSG TEGADERM 2-3/8X2-3/4 SM (GAUZE/BANDAGES/DRESSINGS) ×3 IMPLANT
ELECT BLADE 4.0 EZ CLEAN MEGAD (MISCELLANEOUS) ×6
ELECT REM PT RETURN 9FT ADLT (ELECTROSURGICAL) ×3
ELECTRODE BLDE 4.0 EZ CLN MEGD (MISCELLANEOUS) ×2 IMPLANT
ELECTRODE REM PT RTRN 9FT ADLT (ELECTROSURGICAL) ×1 IMPLANT
GAUZE SPONGE 4X4 12PLY STRL (GAUZE/BANDAGES/DRESSINGS) ×3 IMPLANT
GAUZE XEROFORM 1X8 LF (GAUZE/BANDAGES/DRESSINGS) ×3 IMPLANT
GLOVE BIO SURGEON STRL SZ7.5 (GLOVE) ×6 IMPLANT
GOWN STRL REUS W/ TWL LRG LVL3 (GOWN DISPOSABLE) ×3 IMPLANT
GOWN STRL REUS W/TWL LRG LVL3 (GOWN DISPOSABLE) ×6
HANDLE STAPLE ENDO GIA SHORT (STAPLE) ×2
KIT BASIN OR (CUSTOM PROCEDURE TRAY) ×3 IMPLANT
KIT ROOM TURNOVER OR (KITS) ×3 IMPLANT
KIT SUCTION CATH 14FR (SUCTIONS) ×3 IMPLANT
NS IRRIG 1000ML POUR BTL (IV SOLUTION) ×6 IMPLANT
PACK CHEST (CUSTOM PROCEDURE TRAY) ×3 IMPLANT
PAD ARMBOARD 7.5X6 YLW CONV (MISCELLANEOUS) ×6 IMPLANT
RELOAD TRI 45 ART MED THCK BLK (STAPLE) ×3 IMPLANT
RELOAD TRI 45 ART MED THCK PUR (STAPLE) ×24 IMPLANT
RELOAD TRI 60 ART MED THCK PUR (STAPLE) ×6 IMPLANT
SEALANT PROGEL (MISCELLANEOUS) ×6 IMPLANT
SEALANT SURG COSEAL 4ML (VASCULAR PRODUCTS) ×6 IMPLANT
SOLUTION ANTI FOG 6CC (MISCELLANEOUS) ×3 IMPLANT
SPONGE GAUZE 4X4 12PLY STER LF (GAUZE/BANDAGES/DRESSINGS) ×6 IMPLANT
SPONGE TONSIL 1 RF SGL (DISPOSABLE) ×6 IMPLANT
STAPLER ENDO GIA 12MM SHORT (STAPLE) ×1 IMPLANT
STAPLER VISISTAT 35W (STAPLE) ×3 IMPLANT
SUT CHROMIC 3 0 SH 27 (SUTURE) ×3 IMPLANT
SUT CHROMIC 4 0 PS 2 18 (SUTURE) ×3 IMPLANT
SUT CHROMIC 4 0 SH 27 (SUTURE) ×3 IMPLANT
SUT ETHILON 3 0 PS 1 (SUTURE) ×6 IMPLANT
SUT PROLENE 3 0 SH DA (SUTURE) IMPLANT
SUT PROLENE 4 0 RB 1 (SUTURE)
SUT PROLENE 4 0 SH DA (SUTURE) ×21 IMPLANT
SUT PROLENE 4-0 RB1 .5 CRCL 36 (SUTURE) IMPLANT
SUT SILK  1 MH (SUTURE) ×8
SUT SILK 1 MH (SUTURE) ×4 IMPLANT
SUT SILK 2 0 SH (SUTURE) IMPLANT
SUT SILK 2 0SH CR/8 30 (SUTURE) IMPLANT
SUT SILK 3 0SH CR/8 30 (SUTURE) IMPLANT
SUT VIC AB 1 CTX 18 (SUTURE) ×3 IMPLANT
SUT VIC AB 2 TP1 27 (SUTURE) ×3 IMPLANT
SUT VIC AB 2-0 CT2 18 VCP726D (SUTURE) IMPLANT
SUT VIC AB 2-0 CTX 36 (SUTURE) ×3 IMPLANT
SUT VIC AB 3-0 SH 18 (SUTURE) ×3 IMPLANT
SUT VIC AB 3-0 X1 27 (SUTURE) ×3 IMPLANT
SUT VICRYL 0 UR6 27IN ABS (SUTURE) ×3 IMPLANT
SUT VICRYL 2 TP 1 (SUTURE) ×3 IMPLANT
SWAB COLLECTION DEVICE MRSA (MISCELLANEOUS) IMPLANT
SYSTEM SAHARA CHEST DRAIN ATS (WOUND CARE) ×3 IMPLANT
TAPE CLOTH SURG 4X10 WHT LF (GAUZE/BANDAGES/DRESSINGS) ×3 IMPLANT
TIP APPLICATOR SPRAY EXTEND 16 (VASCULAR PRODUCTS) IMPLANT
TOWEL OR 17X24 6PK STRL BLUE (TOWEL DISPOSABLE) ×3 IMPLANT
TOWEL OR 17X26 10 PK STRL BLUE (TOWEL DISPOSABLE) ×6 IMPLANT
TRAP SPECIMEN MUCOUS 40CC (MISCELLANEOUS) IMPLANT
TRAY FOLEY CATH 16FRSI W/METER (SET/KITS/TRAYS/PACK) ×3 IMPLANT
TROCAR XCEL NON-BLD 11X100MML (ENDOMECHANICALS) ×3 IMPLANT
TUBE ANAEROBIC SPECIMEN COL (MISCELLANEOUS) IMPLANT
WATER STERILE IRR 1000ML POUR (IV SOLUTION) ×6 IMPLANT

## 2014-12-31 NOTE — Progress Notes (Signed)
The patient was examined and preop studies reviewed. There has been no change from the prior exam and the patient is ready for surgery.  The air leak appears to have stopped so will observe with tube clamped for 15-20 minutes to be sure he needs high risk VATS

## 2014-12-31 NOTE — Progress Notes (Signed)
PULMONARY / CRITICAL CARE MEDICINE    Name: Roger Mathews MRN: [id number] DOB: 1946-01-11    ADMISSION DATE:  12/18/2014  PRIMARY SERVICE: PCCM  CHIEF COMPLAINT:  SOB  BRIEF PATIENT DESCRIPTION:  69 y/o male smoker presented to [facility] hospital with dyspnea from Rt pneumothorax.  He has hx of GOLD D COPD with emphysema and was enrolled in hospice.  SIGNIFICANT EVENTS: 2/20  Transfer from [facility] hospital to [facility] 2/23  Change to -20 suction on chest tube 2/25  Change to -10 suction on chest tube 2/27  CT to water seal 2/28  Small air leak on water seal, cxr with no definite PTX 2/29  Persistent air leak > back to suction 3/01  CT to water seal 3/4 VATS > bleb stapling, talc pleuridesis  STUDIES:  2/29 CT chest > R hydropneumothorax, moderate emphysema, dependent atelectasis, atherosclerosis or coronary arteries, aortic valve calcification, multiple renal lesions bilaterally, likely cysts, one 4.2cm lesion left kidney with calcification> would evaluate with MRI in 6 months  SUBJECTIVE:  Underwent VATS pleuridesis and bleb stapling today  VITAL SIGNS: Temp:  [97.8 F (36.6 C)-98.2 F (36.8 C)] 98.2 F (36.8 C) (03/04 1237) Pulse Rate:  [89-107] 89 (03/04 1150) Resp:  [18-24] 24 (03/04 1150) BP: (93-111)/(63-73) 111/73 mmHg (03/04 0437) SpO2:  [93 %-100 %] 100 % (03/04 1150) FiO2 (%):  [50 %] 50 % (03/04 1150) Weight:  [73.9 kg (162 lb 14.7 oz)] 73.9 kg (162 lb 14.7 oz) (03/04 0300)   INTAKE / OUTPUT: Intake/Output      03/03 0701 - 03/04 0700 03/04 0701 - 03/05 0700   P.O. 600    I.V. (mL/kg)  1000 (13.5)   Total Intake(mL/kg) 600 (8.1) 1000 (13.5)   Urine (mL/kg/hr) 800 (0.5) 200 (0.4)   Stool     Blood  100 (0.2)   Total Output 800 300   Net -200 +700          PHYSICAL EXAMINATION: General: sedated on vent  Neuro: sedated on vent HEENT: NCAT, ETT Cardiovascular: RRR, no mgr Lungs: Diminished bilaterally, no wheezing Abdomen: soft, non tender Musculoskeletal:  no edema or cyanosis  LABS:  CBC  Recent Labs Lab 12/28/14 0457 12/30/14 0350 12/30/14 1258  WBC 7.0 7.2 7.1  HGB 11.5* 11.3* 12.2*  HCT 38.8* 38.0* 40.8  PLT 201 205 208   BMET  Recent Labs Lab 12/25/14 0313 12/28/14 0457 12/30/14 0350  NA 142 142 142  K 4.2 3.5 4.3  CL 96 93* 92*  CO2 40* 46* 43*  BUN CREATININE 0.49* 0.50 0.54  GLUCOSE 116* 90 104*   Electrolytes  Recent Labs Lab 12/25/14 0313 12/28/14 0457 12/30/14 0350  CALCIUM 8.3* 8.3* 9.0    Imaging Dg Chest Port 1 View  12/31/2014   CLINICAL DATA:  Hypoxia  EXAM: PORTABLE CHEST - 1 VIEW  COMPARISON:  December 30, 2014  FINDINGS: Endotracheal tube tip is 3.0 cm above the carina. There is a chest tube on the right. Central catheter tip is in the superior vena cava. Note that the chest tube has been advanced toward the midline compared to 1 day prior. The previously noted pneumothorax on the right is not appreciable currently. Extensive subcutaneous air remains on the right. There is also subcutaneous air on the left.  There is mild bibasilar atelectasis. Lungs are otherwise clear. Heart size and pulmonary vascularity are normal. No adenopathy. No bone lesions.  IMPRESSION: Tube and catheter positions as described. Extensive subcutaneous  air but no demonstrable pneumothorax. Mild bibasilar atelectasis. No edema or consolidation.   Electronically Signed   By: Bretta BangWilliam  Woodruff III M.D.   On: 12/31/2014 13:16   Dg Chest Port 1 View  12/30/2014   CLINICAL DATA:  Assess chest tube positioning  EXAM: PORTABLE CHEST - 1 VIEW  COMPARISON:  PA and lateral chest x-ray of December 29, 2014  FINDINGS: The lungs are hyperinflated. A large amount of subcutaneous emphysema is present bilaterally. A tiny pleural line remains visible along the right lower lateral thoracic wall but the volume of the pneumothorax has decreased. The chest tube tip lies just above the posterior lateral aspect of the right seventh rib. The proximal port  likely lies just inside the pleural space. The cardiac silhouette and pulmonary vascularity are normal.  IMPRESSION: Further interval decrease in the volume of the right-sided pneumothorax. The proximal port of the chest tube may lie just outside the pleural space but it has not significantly changed in positioning since yesterday's study. The left lung is clear.   Electronically Signed   By: David  [place]azil[place]   On: 12/30/2014 07:46    ASSESSMENT / PLAN:  Spontaneous R Pneumothorax with Subcutaneous Emphysema > s/p VATS pleuridesis 2/4 Post op vent dependence 3/4 Severe, GOLD D COPD with emphysema Protein calorie malnutrition Physical Deconditioning Kidney cysts noted, radiology recommends MRI in 6 months Post op hypotension> suspect sedation related  Plan:   -full vent support for now -wean as able -PAD protocol 1, RASS goal -1 -wean off neo -change symbicort to brovana/pulmicort, prn albuterol -OG tube -pepcid for SUP prophylaxis -SCD for DVT prophylaxis  My cc time 40 minutes  Heber CarolinaBrent Molina Hollenback, MD [place] [facility] Pager: [phone] Cell: [phone] If no response, call [phone]

## 2014-12-31 NOTE — Transfer of Care (Signed)
Immediate Anesthesia Transfer of Care Note  Patient: Roger Mathews  Procedure(s) Performed: Procedure(s): VIDEO ASSISTED THORACOSCOPY (Right) STAPLING OF BLEBS (Right)  Patient Location: ICU  Anesthesia Type:General  Level of Consciousness: sedated and Patient remains intubated per anesthesia plan  Airway & Oxygen Therapy: Patient connected to T-piece oxygen and Patient remains intubated per anesthesia plan  Post-op Assessment: Report given to RN and Post -op Vital signs reviewed and stable  Post vital signs: Reviewed and stable  Last Vitals:  Filed Vitals:   12/31/14 1150  BP:   Pulse: 89  Temp:   Resp: 24    Complications: No apparent anesthesia complications

## 2014-12-31 NOTE — Brief Op Note (Signed)
12/18/2014 - 12/31/2014  11:49 AM  PATIENT:  Roger Mathews  69 y.o. male  PRE-OPERATIVE DIAGNOSIS:  RIGHT PTX, PERSISTANT AIR LEAK  POST-OPERATIVE DIAGNOSIS:  RIGHT PTX, PERSISTANT AIR LEAK  PROCEDURE:  Procedure(s): VIDEO ASSISTED THORACOSCOPY (Right) STAPLING OF BLEBS (Right)  SURGEON:  Surgeon(s) and Role:    * Kerin PernaPeter Van Trigt, MD - Primary  PHYSICIAN ASSISTANT:   Fayne MediateASSISTANT C Clayton RNFA  ANESTHESIA:   general  EBL:  Total I/O In: 400 [I.V.:400] Out: 300 [Urine:200; Blood:100]  BLOOD ADMINISTERED:none  DRAINS: 30 F chest tube   LOCAL MEDICATIONS USED:  NONE  SPECIMEN:  Excision, blebs of RUL  ,  RLL  DISPOSITION OF SPECIMEN:  PATHOLOGY  COUNTS:  YES  TOURNIQUET:  * No tourniquets in log *  DICTATION: .Dragon Dictation  PLAN OF CARE: Admit to inpatient   PATIENT DISPOSITION:  ICU - intubated and hemodynamically stable.   Delay start of Pharmacological VTE agent (>24hrs) due to surgical blood loss or risk of bleeding: yes

## 2014-12-31 NOTE — Procedures (Signed)
Extubation Procedure Note  Patient Details:   Name: Roger Mathews DOB: 1945-12-11 MRN: [id number]   Airway Documentation:     Evaluation  O2 sats: stable throughout Complications: No apparent complications Patient did tolerate procedure well. Bilateral Breath Sounds: Rhonchi Suctioning: Oral, Airway Yes   Patient extubated to 4L nasal cannula per MD order.  Positive cuff leak noted.  No evidence of stridor.  Sats currently 94%.  Vitals are stable.  Patient able to speak post extubation.  Incentive spirometry performed x5 with achieved goal of 400.  No apparent complications.  [name], [name] 12/31/2014, 3:23 PM

## 2014-12-31 NOTE — Progress Notes (Signed)
Obtained sputum sample from patient ETT and sent down to main lab without any complications.

## 2014-12-31 NOTE — Progress Notes (Signed)
[facility]  Extubated, looks good, slightly confused, tachycardic O2 saturation good  Bolus 500cc saline  Heber CarolinaBrent Mavi Un, MD [place] [facility] Pager: [phone] Cell: [phone] If no response, call [phone]

## 2014-12-31 NOTE — Anesthesia Preprocedure Evaluation (Addendum)
Anesthesia Evaluation  Patient identified by MRN, date of birth, ID band Patient awake    Reviewed: Allergy & Precautions, NPO status , Patient's Chart, lab work & pertinent test results  Airway Mallampati: II   Neck ROM: full    Dental   Pulmonary COPDCurrent Smoker,          Cardiovascular hypertension,     Neuro/Psych Anxiety Depression    GI/Hepatic   Endo/Other    Renal/GU      Musculoskeletal   Abdominal   Peds  Hematology   Anesthesia Other Findings   Reproductive/Obstetrics                            Anesthesia Physical Anesthesia Plan  ASA: II  Anesthesia Plan: General   Post-op Pain Management:    Induction: Intravenous  Airway Management Planned: Double Lumen EBT  Additional Equipment:   Intra-op Plan:   Post-operative Plan: Extubation in OR  Informed Consent: I have reviewed the patients History and Physical, chart, labs and discussed the procedure including the risks, benefits and alternatives for the proposed anesthesia with the patient or authorized representative who has indicated his/her understanding and acceptance.     Plan Discussed with: CRNA, Anesthesiologist and Surgeon  Anesthesia Plan Comments:         Anesthesia Quick Evaluation

## 2014-12-31 NOTE — Anesthesia Procedure Notes (Signed)
Procedure Name: Intubation Date/Time: 12/31/2014 8:01 AM Performed by: Coralee RudFLORES, Jachin Coury Pre-anesthesia Checklist: Patient identified, Emergency Drugs available, Suction available, Patient being monitored and Timeout performed Patient Re-evaluated:Patient Re-evaluated prior to inductionOxygen Delivery Method: Circle system utilized Preoxygenation: Pre-oxygenation with 100% oxygen Intubation Type: IV induction Ventilation: Oral airway inserted - appropriate to patient size and Mask ventilation without difficulty Laryngoscope Size: Mac and 3 Grade View: Grade II Tube type: Oral Endobronchial tube: Double lumen EBT and 39 Fr Laser Tube: Cuffed inflated with minimal occlusive pressure - saline Number of attempts: 1 Airway Equipment and Method: Stylet and Fiberoptic brochoscope (tube placement verified by fiberoptic bronchoscope) Placement Confirmation: ETT inserted through vocal cords under direct vision,  positive ETCO2,  CO2 detector and breath sounds checked- equal and bilateral Secured at: 31 cm Dental Injury: Teeth and Oropharynx as per pre-operative assessment

## 2015-01-01 ENCOUNTER — Inpatient Hospital Stay (HOSPITAL_COMMUNITY): Payer: Medicare Other

## 2015-01-01 LAB — BASIC METABOLIC PANEL
Anion gap: 5 (ref 5–15)
BUN: 6 mg/dL (ref 6–23)
CO2: 36 mmol/L — ABNORMAL HIGH (ref 19–32)
Calcium: 8.4 mg/dL (ref 8.4–10.5)
Chloride: 100 mmol/L (ref 96–112)
Creatinine, Ser: 0.49 mg/dL — ABNORMAL LOW (ref 0.50–1.35)
GFR calc Af Amer: >90 mL/min (ref >90)
GFR calc non Af Amer: >90 mL/min (ref >90)
Glucose, Bld: 179 mg/dL — ABNORMAL HIGH (ref 70–99)
Potassium: 4 mmol/L (ref 3.5–5.1)
Sodium: 141 mmol/L (ref 135–145)

## 2015-01-01 LAB — CBC
HCT: 35.9 % — ABNORMAL LOW (ref 39.0–52.0)
Hemoglobin: 11 g/dL — ABNORMAL LOW (ref 13.0–17.0)
MCH: 28.9 pg (ref 26.0–34.0)
MCHC: 30.6 g/dL (ref 30.0–36.0)
MCV: 94.5 fL (ref 78.0–100.0)
Platelets: 218 10*3/uL (ref 150–400)
RBC: 3.8 MIL/uL — ABNORMAL LOW (ref 4.22–5.81)
RDW: 15.4 % (ref 11.5–15.5)
WBC: 19 10*3/uL — ABNORMAL HIGH (ref 4.0–10.5)

## 2015-01-01 LAB — GLUCOSE, CAPILLARY
GLUCOSE-CAPILLARY: 143 mg/dL — AB (ref 70–99)
GLUCOSE-CAPILLARY: 142 mg/dL — AB (ref 70–99)
Glucose-Capillary: 152 mg/dL — ABNORMAL HIGH (ref 70–99)

## 2015-01-01 LAB — POCT I-STAT 3, ART BLOOD GAS (G3+)
ACID-BASE EXCESS: 8 mmol/L — AB (ref 0.0–2.0)
Bicarbonate: 38 mEq/L — ABNORMAL HIGH (ref 20.0–24.0)
O2 Saturation: 90 %
PH ART: 7.275 — AB (ref 7.350–7.450)
PO2 ART: 68 mmHg — AB (ref 80.0–100.0)
TCO2: 41 mmol/L (ref 0–100)
pCO2 arterial: 81.3 mmHg (ref 35.0–45.0)

## 2015-01-01 MED ORDER — PANTOPRAZOLE SODIUM 40 MG PO TBEC
40.0000 mg | DELAYED_RELEASE_TABLET | Freq: Every day | ORAL | Status: DC
  Administered 2015-01-01 – 2015-01-07 (×7): 40 mg via ORAL
  Filled 2015-01-01 (×7): qty 1

## 2015-01-01 MED ORDER — BUDESONIDE-FORMOTEROL FUMARATE 160-4.5 MCG/ACT IN AERO
2.0000 | INHALATION_SPRAY | Freq: Two times a day (BID) | RESPIRATORY_TRACT | Status: DC
  Administered 2015-01-01 – 2015-01-07 (×10): 2 via RESPIRATORY_TRACT
  Filled 2015-01-01: qty 6

## 2015-01-01 MED ORDER — INSULIN ASPART 100 UNIT/ML ~~LOC~~ SOLN: 0.0000 [IU] | Freq: Every day | SUBCUTANEOUS | Status: DC

## 2015-01-01 MED ORDER — INSULIN ASPART 100 UNIT/ML ~~LOC~~ SOLN
0.0000 [IU] | Freq: Three times a day (TID) | SUBCUTANEOUS | Status: DC
  Administered 2015-01-01: 3 [IU] via SUBCUTANEOUS
  Administered 2015-01-01: 2 [IU] via SUBCUTANEOUS

## 2015-01-01 NOTE — Op Note (Signed)
[name], Mathews NO.:  [id number]  MEDICAL RECORD NO.:  [id number]  LOCATION:  2S16C                        FACILITY:  [facility]  PHYSICIAN:  Kerin Perna, M.D.  DATE OF BIRTH:  Aug 13, 1946  DATE OF PROCEDURE:  12/31/2014 DATE OF DISCHARGE:                              OPERATIVE REPORT OPERATION : right VATS, stable no blebs, pleurodesis    SURGEON:  Kerin Perna, M.D.  ASSISTANT:  Emerson Monte, RNFA.  ANESTHESIA:  General by Achille Rich, MD.  PREOPERATIVE DIAGNOSES:  Severe chronic obstructive pulmonary disease, spontaneous pneumothorax with persistent large air leak 2 weeks after placement of chest tube.  POSTOPERATIVE DIAGNOSES:  Severe chronic obstructive pulmonary disease, spontaneous pneumothorax with persistent large air leak 2 weeks after placement of chest tube.  CLINICAL NOTE:  The patient is a 69 year old Caucasian male, smoker with severe COPD, on home oxygen, who presented to an outside hospital with large spontaneous pneumothorax and respiratory distress.  A chest tube was placed.  He was then transferred to this facility where he was admitted by the Pulmonary Critical Care Service.  He was maintained on a telemetry unit and did not require intubation.  He had a large air leak to the chest tube.  CT scan of the chest showed the chest tube to be in appropriate condition with a residual pneumothorax.  There were several blebs in the right lung.  Because of the persistent large air leak for 2 weeks after chest tube placement, thoracic surgical evaluation was requested.  I evaluated the patient and reviewed the CT scan and recommended a right VATS for stapling of blebs and pleurodesis.  I discussed the procedure in detail with the patient as well as his wife, Roger Mathews.  I reviewed the risks of the surgery with the patient including risks of ventilator dependence because of his severe COPD, bleeding, recurrent pneumothorax, persistent air  leak, infection, and death.  He demonstrated his understanding, agreed to proceed with surgery under what I felt was an informed consent.  OPERATIVE PROCEDURE:  The patient was brought to the operating room and placed supine on the operating room table where general anesthesia was induced and a double-lumen endotracheal tube was placed.  The patient was turned to expose the right chest up.  The right chest tube was removed.  The right chest was prepped and draped as a sterile field.  A proper time-out was performed.  A small incision was made at the tip of the scapula and a right pleural space entered.  A VATS camera was inserted.  The right lung was inspected.  It was fairly emphysematous.  There were some dense thick adhesions posteriorly.  There were narrow band-like adhesions at the apex into the anterior diaphragmatic surface.  These were taken down through a small incision made from the tip of the scapula anteriorly. The lung was inspected and palpated.  Several blebs were excised using the Endo-GIA stapling device to remove the blebs with a wedge of the lung tissue in the areas of the upper lobe and lower lobe.  The staple loads were used with the biologic tissue covering the staples to reduce air leak through the stapled holes.  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  This was repaired with some interrupted chromic sutures. The staple lines and the suture lines were covered with a thin layer of biologic adhesive-Coseal as well as an application of Progel.  The lungs were gently reinflated and warm saline was placed in the pleural space. There were no significant air leaks noted.  The water was drained.  5 g of talc was insufflated in the pleural space to create a chemical pleurodesis.  A 28-French anterior chest tube was placed and brought up through separate incision and secured to the skin.  The  incision was closed with interrupted #2 pericostal sutures.  The ribs were reapproximated after the lungs were expanded.  The muscle layer was closed with interrupted Vicryl.  The subcutaneous layer was closed in running Vicryl and the skin was closed with staples.  The VATS portal incision was closed with interrupted Vicryl and an interrupted nylon layer on the skin.  The previously placed chest tube site was sharply debrided, irrigated, and closed with interrupted Vicryl and the subcutaneous layer and interrupted nylon for the skin.  The patient was then turned supine. The chest tube was connected to an underwater seal Pleur-Evac drainage system.  The patient was extubated, but was not adequately ventilating, so a single-lumen subglottic endotracheal tube was positioned by the Anesthesia team.  The patient was then transported to the ICU in stable condition.     Kerin PernaPeter Van Mathews, M.D.     PV/MEDQ  D:  12/31/2014  T:  01/01/2015  Job:  [id number]

## 2015-01-01 NOTE — Progress Notes (Signed)
Patient examined and record reviewed.Hemodynamics stable,labs satisfactory.Patient had stable day.Continue current care. VAN TRIGT III,Dottie Vaquerano 01/01/2015   

## 2015-01-01 NOTE — Progress Notes (Signed)
PULMONARY / CRITICAL CARE MEDICINE    Name: Roger Mathews MRN: [id number] DOB: 1946-02-26    ADMISSION DATE:  12/18/2014  PRIMARY SERVICE: PCCM  CHIEF COMPLAINT:  SOB  BRIEF PATIENT DESCRIPTION:  69 y/o male smoker presented to [facility] hospital with dyspnea from Rt pneumothorax.  He has hx of GOLD D COPD with emphysema and was enrolled in hospice.  SIGNIFICANT EVENTS: 2/20  Transfer from [facility] hospital to [facility] 2/23  Change to -20 suction on chest tube 2/25  Change to -10 suction on chest tube 2/27  CT to water seal 2/28  Small air leak on water seal, cxr with no definite PTX 2/29  Persistent air leak > back to suction 3/01  CT to water seal 3/4 VATS > bleb stapling, talc pleuridesis, extubated  STUDIES:  2/29 CT chest > R hydropneumothorax, moderate emphysema, dependent atelectasis, atherosclerosis or coronary arteries, aortic valve calcification, multiple renal lesions bilaterally, likely cysts, one 4.2cm lesion left kidney with calcification> would evaluate with MRI in 6 months  SUBJECTIVE:  Underwent VATS pleuridesis and bleb stapling yesterday, extabated  VITAL SIGNS: Temp:  [97.4 F (36.3 C)-99.3 F (37.4 C)] 98.2 F (36.8 C) (03/05 0700) Pulse Rate:  [78-106] 106 (03/05 1000) Resp:  [0-28] 20 (03/05 1146) BP: (97-136)/(52-91) 116/83 mmHg (03/05 1100) SpO2:  [81 %-100 %] 97 % (03/05 1146) Arterial Line BP: (92-141)/(42-73) 126/65 mmHg (03/05 1100) FiO2 (%):  [30 %-40 %] 35 % (03/05 1145) Weight:  [74.3 kg (163 lb 12.8 oz)] 74.3 kg (163 lb 12.8 oz) (03/05 0600)   INTAKE / OUTPUT: Intake/Output      03/04 0701 - 03/05 0700 03/05 0701 - 03/06 0700   P.O. 720    I.V. (mL/kg) 3251.9 (43.8) 367.5 (4.9)   IV Piggyback 650    Total Intake(mL/kg) 4621.9 (62.2) 367.5 (4.9)   Urine (mL/kg/hr) 5940 (3.3) 475 (0.9)   Blood 100 (0.1)    Chest Tube 280 (0.2) 0 (0)   Total Output 6320 475   Net -1698.1 -107.5          PHYSICAL EXAMINATION: General: sitting up in chair   Neuro: sitting up in chair HEENT: NCAT, ETT Cardiovascular: RRR, no mgr Lungs: Diminished bilaterally, no wheezing Abdomen: soft, non tender Musculoskeletal: no edema or cyanosis  LABS:  CBC  Recent Labs Lab 12/30/14 1258 12/31/14 1416 01/01/15 0324  WBC 7.1 12.7* 19.0*  HGB 12.2* 11.2* 11.0*  HCT 40.8 36.7* 35.9*  PLT 208 207 218   BMET  Recent Labs Lab 12/30/14 0350 12/31/14 1416 01/01/15 0324  NA 142 138 141  K 4.3 3.7 4.0  CL 92* 99 100  CO2 43* 32 36*  BUN CREATININE 0.54 0.46* 0.49*  GLUCOSE 104* 127* 179*   Electrolytes  Recent Labs Lab 12/30/14 0350 12/31/14 1416 01/01/15 0324  CALCIUM 9.0 7.5* 8.4    Imaging Dg Chest Port 1 View  01/01/2015   CLINICAL DATA:  Acute respiratory failure with hypoxia. History of hypertension and COPD.  EXAM: PORTABLE CHEST - 1 VIEW  COMPARISON:  12/31/2014 and 12/30/2014.  FINDINGS: 0524 hours. Interval extubation. Right IJ central venous catheter and right chest tube remain in place. No residual pneumothorax identified. Peripheral right lung opacity and diffuse chest wall soft tissue emphysema again noted. The heart size and mediastinal contours are stable.  IMPRESSION: Stable examination following extubation. No recurrent pneumothorax demonstrated.   Electronically Signed   By: Carey Bullocks M.D.   On: 01/01/2015 09:10  Dg Chest Port 1 View  12/31/2014   CLINICAL DATA:  Hypoxia  EXAM: PORTABLE CHEST - 1 VIEW  COMPARISON:  December 30, 2014  FINDINGS: Endotracheal tube tip is 3.0 cm above the carina. There is a chest tube on the right. Central catheter tip is in the superior vena cava. Note that the chest tube has been advanced toward the midline compared to 1 day prior. The previously noted pneumothorax on the right is not appreciable currently. Extensive subcutaneous air remains on the right. There is also subcutaneous air on the left.  There is mild bibasilar atelectasis. Lungs are otherwise clear. Heart size and  pulmonary vascularity are normal. No adenopathy. No bone lesions.  IMPRESSION: Tube and catheter positions as described. Extensive subcutaneous air but no demonstrable pneumothorax. Mild bibasilar atelectasis. No edema or consolidation.   Electronically Signed   By: Bretta BangWilliam  Woodruff III M.D.   On: 12/31/2014 13:16    ASSESSMENT / PLAN:  Spontaneous R Pneumothorax with Subcutaneous Emphysema > s/p VATS pleuridesis 3/4, still with persistent air leak 3/5 Severe, GOLD D COPD with emphysema Protein calorie malnutrition Hyperglycemia Physical Deconditioning Kidney cysts noted, radiology recommends MRI in 6 months Post op hypotension> suspect sedation related Hypotension post op> presumably sedation/pain med related, he is doing well so not in shock  Plan:   -out of bed, incentive spirometry -change back to symbicort -wean off neo, remove a-line -change symbicort to brovana/pulmicort, prn albuterol -OG tube -pepcid for SUP prophylaxis -SCD for DVT prophylaxis  To remain in ICU  Heber CarolinaBrent McQuaid, MD [place] [facility] Pager: [phone] Cell: [phone] If no response, call [phone]

## 2015-01-01 NOTE — Progress Notes (Signed)
1 Day Post-Op Procedure(s) (LRB): VIDEO ASSISTED THORACOSCOPY (Right) STAPLING OF BLEBS (Right) Subjective: Status post right VATS wedge resection of blebs with pleurodesis following spontaneous pneumothorax and prolonged air leak Severe COPD on home oxygen with baseline PCO2 82 Doing fairly well extubated out of bed to chair today Chest x-ray shows no pneumothorax Significant 1 column airleak persistent-leave chest tube to suction Postop pain well-controlled with PCA-we'll continue Leave in ICU for severe COPD  Objective: Vital signs in last 24 hours: Temp:  [97.4 F (36.3 C)-99.3 F (37.4 C)] 98.2 F (36.8 C) (03/05 0700) Pulse Rate:  [58-106] 106 (03/05 1000) Cardiac Rhythm:  [-] Normal sinus rhythm (03/05 1000) Resp:  [0-28] 20 (03/05 1146) BP: (92-136)/(52-91) 116/83 mmHg (03/05 1100) SpO2:  [81 %-100 %] 97 % (03/05 1146) Arterial Line BP: (88-141)/(42-73) 126/65 mmHg (03/05 1100) FiO2 (%):  [30 %-40 %] 35 % (03/05 1145) Weight:  [163 lb 12.8 oz (74.3 kg)] 163 lb 12.8 oz (74.3 kg) (03/05 0600)  Hemodynamic parameters for last 24 hours:   stable  Intake/Output from previous day: 03/04 0701 - 03/05 0700 In: 4621.9 [P.O.:720; I.V.:3251.9; IV Piggyback:650] Out: 6320 [Urine:5940; Blood:100; Chest Tube:280] Intake/Output this shift: Total I/O In: 367.5 [I.V.:367.5] Out: 475 [Urine:475]  Alert and appropriate Scattered rhonchi and wheezes bilaterally Positive one column air leak from chest tube Sinus rhythm  Lab Results:  Recent Labs  12/31/14 1416 01/01/15 0324  WBC 12.7* 19.0*  HGB 11.2* 11.0*  HCT 36.7* 35.9*  PLT 207 218   BMET:  Recent Labs  12/31/14 1416 01/01/15 0324  NA 138 141  K 3.7 4.0  CL 99 100  CO2 32 36*  GLUCOSE 127* 179*  BUN 10 6  CREATININE 0.46* 0.49*  CALCIUM 7.5* 8.4    PT/INR:  Recent Labs  12/31/14 0417  LABPROT 12.4  INR 0.92   ABG    Component Value Date/Time   PHART 7.275* 01/01/2015 0325   HCO3 38.0* 01/01/2015  0325   TCO2 41 01/01/2015 0325   O2SAT 90.0 01/01/2015 0325   CBG (last 3)  No results for input(s): GLUCAP in the last 72 hours.  Assessment/Plan: S/P Procedure(s) (LRB): VIDEO ASSISTED THORACOSCOPY (Right) STAPLING OF BLEBS (Right) Continue current care We'll cover elevated blood sugars with sliding scale before meals-at bedtime Follow-up pathology   LOS: 14 days    VAN TRIGT III,PETER 01/01/2015

## 2015-01-02 ENCOUNTER — Inpatient Hospital Stay (HOSPITAL_COMMUNITY): Payer: Medicare Other

## 2015-01-02 DIAGNOSIS — E43 Unspecified severe protein-calorie malnutrition: Secondary | ICD-10-CM

## 2015-01-02 LAB — GLUCOSE, CAPILLARY
GLUCOSE-CAPILLARY: 85 mg/dL (ref 70–99)
GLUCOSE-CAPILLARY: 91 mg/dL (ref 70–99)
Glucose-Capillary: 93 mg/dL (ref 70–99)

## 2015-01-02 LAB — COMPREHENSIVE METABOLIC PANEL
ALT: 27 U/L (ref 0–53)
AST: 24 U/L (ref 0–37)
Albumin: 2.6 g/dL — ABNORMAL LOW (ref 3.5–5.2)
Alkaline Phosphatase: 56 U/L (ref 39–117)
Anion gap: 3 — ABNORMAL LOW (ref 5–15)
BUN: 11 mg/dL (ref 6–23)
CO2: 39 mmol/L — ABNORMAL HIGH (ref 19–32)
Calcium: 8.6 mg/dL (ref 8.4–10.5)
Chloride: 100 mmol/L (ref 96–112)
Creatinine, Ser: 0.41 mg/dL — ABNORMAL LOW (ref 0.50–1.35)
GFR calc Af Amer: >90 mL/min (ref >90)
GFR calc non Af Amer: >90 mL/min (ref >90)
Glucose, Bld: 122 mg/dL — ABNORMAL HIGH (ref 70–99)
Potassium: 4.2 mmol/L (ref 3.5–5.1)
Sodium: 142 mmol/L (ref 135–145)
Total Bilirubin: 0.4 mg/dL (ref 0.3–1.2)
Total Protein: 5.5 g/dL — ABNORMAL LOW (ref 6.0–8.3)

## 2015-01-02 LAB — CBC
HCT: 35.1 % — ABNORMAL LOW (ref 39.0–52.0)
Hemoglobin: 10.5 g/dL — ABNORMAL LOW (ref 13.0–17.0)
MCH: 28.9 pg (ref 26.0–34.0)
MCHC: 29.9 g/dL — ABNORMAL LOW (ref 30.0–36.0)
MCV: 96.7 fL (ref 78.0–100.0)
Platelets: 173 10*3/uL (ref 150–400)
RBC: 3.63 MIL/uL — ABNORMAL LOW (ref 4.22–5.81)
RDW: 15.4 % (ref 11.5–15.5)
WBC: 16.5 10*3/uL — ABNORMAL HIGH (ref 4.0–10.5)

## 2015-01-02 MED ORDER — SORBITOL 70 % SOLN
30.0000 mL | Freq: Once | Status: AC
Start: 2015-01-02 — End: 2015-01-02
  Administered 2015-01-02: 30 mL via ORAL
  Filled 2015-01-02: qty 30

## 2015-01-02 NOTE — Progress Notes (Signed)
CT surgery p.m. Rounds  Patient out of bed most of day Air leak from chest tube improving Oxygen saturations remains greater than 92% Small air leak precludes removal of chest tube

## 2015-01-02 NOTE — Progress Notes (Signed)
2 Days Post-Op Procedure(s) (LRB): VIDEO ASSISTED THORACOSCOPY (Right) STAPLING OF BLEBS (Right) Subjective: breathing comfortably Air leak improved sats ok on 6L nasal canula  Objective: Vital signs in last 24 hours: Temp:  [96.4 F (35.8 C)-97.8 F (36.6 C)] 97.8 F (36.6 C) (03/06 1100) Pulse Rate:  [63-105] 105 (03/06 1100) Cardiac Rhythm:  [-] Normal sinus rhythm (03/06 1100) Resp:  [14-29] 23 (03/06 1100) BP: (74-123)/(51-81) 82/64 mmHg (03/06 1100) SpO2:  [93 %-100 %] 100 % (03/06 1100) Arterial Line BP: (92)/(45) 92/45 mmHg (03/05 1400) FiO2 (%):  [35 %] 35 % (03/06 1000) Weight:  [167 lb 8.8 oz (76 kg)] 167 lb 8.8 oz (76 kg) (03/06 0600)  Hemodynamic parameters for last 24 hours:  stable  Intake/Output from previous day: 03/05 0701 - 03/06 0700 In: 1320 [P.O.:340; I.V.:930; IV Piggyback:50] Out: 2795 [Urine:2325; Chest Tube:470] Intake/Output this shift: Total I/O In: 120 [I.V.:120] Out: 115 [Urine:115]  Scattered rhonchi nsr  Lab Results:  Recent Labs  01/01/15 0324 01/02/15 0400  WBC 19.0* 16.5*  HGB 11.0* 10.5*  HCT 35.9* 35.1*  PLT 218 173   BMET:  Recent Labs  01/01/15 0324 01/02/15 0400  NA 141 142  K 4.0 4.2  CL 100 100  CO2 36* 39*  GLUCOSE 179* 122*  BUN 6 11  CREATININE 0.49* 0.41*  CALCIUM 8.4 8.6    PT/INR:  Recent Labs  12/31/14 0417  LABPROT 12.4  INR 0.92   ABG    Component Value Date/Time   PHART 7.275* 01/01/2015 0325   HCO3 38.0* 01/01/2015 0325   TCO2 41 01/01/2015 0325   O2SAT 90.0 01/01/2015 0325   CBG (last 3)   Recent Labs  01/01/15 2025 01/02/15 0743 01/02/15 1135  GLUCAP 142* 93 85    Assessment/Plan: S/P Procedure(s) (LRB): VIDEO ASSISTED THORACOSCOPY (Right) STAPLING OF BLEBS (Right) Leave tube to suction ambulate tx to step down when stable on 4 L   LOS: 15 days    VAN TRIGT III,PETER 01/02/2015

## 2015-01-02 NOTE — Progress Notes (Signed)
PULMONARY / CRITICAL CARE MEDICINE    Name: Roger Mathews MRN: [id number] DOB: 03/28/1946    ADMISSION DATE:  12/18/2014  PRIMARY SERVICE: PCCM  CHIEF COMPLAINT:  SOB  BRIEF PATIENT DESCRIPTION:  69 y/o male smoker presented to [facility] hospital with dyspnea from Rt pneumothorax.  He has hx of GOLD D COPD with emphysema and was enrolled in hospice.  SIGNIFICANT EVENTS: 2/20  Transfer from [facility] hospital to [facility] 2/23  Change to -20 suction on chest tube 2/25  Change to -10 suction on chest tube 2/27  CT to water seal 2/28  Small air leak on water seal, cxr with no definite PTX 2/29  Persistent air leak > back to suction 3/01  CT to water seal 3/4 VATS > bleb stapling, talc pleuridesis, extubated  STUDIES:  2/29 CT chest > R hydropneumothorax, moderate emphysema, dependent atelectasis, atherosclerosis or coronary arteries, aortic valve calcification, multiple renal lesions bilaterally, likely cysts, one 4.2cm lesion left kidney with calcification> would evaluate with MRI in 6 months  SUBJECTIVE:  Feeling better  VITAL SIGNS: Temp:  [96.4 F (35.8 C)-97.8 F (36.6 C)] 97.8 F (36.6 C) (03/06 1100) Pulse Rate:  [63-105] 97 (03/06 1400) Resp:  [14-29] 15 (03/06 1400) BP: (81-123)/(51-81) 103/64 mmHg (03/06 1400) SpO2:  [93 %-100 %] 100 % (03/06 1400) FiO2 (%):  [35 %] 35 % (03/06 1000) Weight:  [76 kg (167 lb 8.8 oz)] 76 kg (167 lb 8.8 oz) (03/06 0600)   INTAKE / OUTPUT: Intake/Output      03/05 0701 - 03/06 0700 03/06 0701 - 03/07 0700   P.O. 340 600   I.V. (mL/kg) 930 (12.2) 210 (2.8)   IV Piggyback 50    Total Intake(mL/kg) 1320 (17.4) 810 (10.7)   Urine (mL/kg/hr) 2325 (1.3) 340 (0.6)   Blood     Chest Tube 470 (0.3)    Total Output 2795 340   Net -1475 +470          PHYSICAL EXAMINATION: General: sitting up in chair  Neuro: sitting up in chair HEENT: NCAT, ETT Cardiovascular: RRR, no mgr Lungs: Diminished bilaterally, no wheezing Abdomen: soft, non  tender Musculoskeletal: no edema or cyanosis  LABS:  CBC  Recent Labs Lab 12/31/14 1416 01/01/15 0324 01/02/15 0400  WBC 12.7* 19.0* 16.5*  HGB 11.2* 11.0* 10.5*  HCT 36.7* 35.9* 35.1*  PLT 207 218 173   BMET  Recent Labs Lab 12/31/14 1416 01/01/15 0324 01/02/15 0400  NA 138 141 142  K 3.7 4.0 4.2  CL 99 100 100  CO2 32 36* 39*  BUN CREATININE 0.46* 0.49* 0.41*  GLUCOSE 127* 179* 122*   Electrolytes  Recent Labs Lab 12/31/14 1416 01/01/15 0324 01/02/15 0400  CALCIUM 7.5* 8.4 8.6    Imaging Dg Chest Port 1 View  01/02/2015   CLINICAL DATA:  Right pneumothorax. Chest tube. COPD. Bronchopleural fistula.  EXAM: PORTABLE CHEST - 1 VIEW  COMPARISON:  01/01/2015  FINDINGS: Right chest tube and right jugular central venous catheter remain in place. No residual pneumothorax visualized. Pulmonary opacity in the peripheral right mid and lower lung fields is stable. Changes of COPD again noted. Heart size is within normal limits.  IMPRESSION: Stable airspace opacity in peripheral right lung field. No recurrent pneumothorax visualized.  COPD.   Electronically Signed   By: Myles Rosenthal M.D.   On: 01/02/2015 07:26   Dg Chest Port 1 View  01/01/2015   CLINICAL DATA:  Acute respiratory failure with  hypoxia. History of hypertension and COPD.  EXAM: PORTABLE CHEST - 1 VIEW  COMPARISON:  12/31/2014 and 12/30/2014.  FINDINGS: 0524 hours. Interval extubation. Right IJ central venous catheter and right chest tube remain in place. No residual pneumothorax identified. Peripheral right lung opacity and diffuse chest wall soft tissue emphysema again noted. The heart size and mediastinal contours are stable.  IMPRESSION: Stable examination following extubation. No recurrent pneumothorax demonstrated.   Electronically Signed   By: Carey BullocksWilliam  Veazey M.D.   On: 01/01/2015 09:10    ASSESSMENT / PLAN:  Spontaneous R Pneumothorax with Subcutaneous Emphysema > s/p VATS pleuridesis 3/4, still  with persistent air leak 3/5 Severe, GOLD D COPD with emphysema Protein calorie malnutrition Hyperglycemia Physical Deconditioning Kidney cysts noted, radiology recommends MRI in 6 months Post op hypotension> suspect sedation related Hypotension post op> presumably sedation/pain med related, he is doing well so not in shock  Plan:   -out of bed, incentive spirometry -continue symbicort and albuterol -d/c pepcid for SUP prophylaxis -SCD for DVT prophylaxis -ambulate daily -regular diet  To remain in ICU today  Heber CarolinaBrent Donovan Gatchel, MD [place] [facility] Pager: [phone] Cell: [phone] If no response, call [phone]

## 2015-01-03 ENCOUNTER — Inpatient Hospital Stay (HOSPITAL_COMMUNITY): Payer: Medicare Other

## 2015-01-03 ENCOUNTER — Encounter (HOSPITAL_COMMUNITY): Payer: Self-pay | Admitting: Cardiothoracic Surgery

## 2015-01-03 LAB — GLUCOSE, CAPILLARY
Glucose-Capillary: 106 mg/dL — ABNORMAL HIGH (ref 70–99)
Glucose-Capillary: 78 mg/dL (ref 70–99)
Glucose-Capillary: 97 mg/dL (ref 70–99)
Glucose-Capillary: 94 mg/dL (ref 70–99)
Glucose-Capillary: 122 mg/dL — ABNORMAL HIGH (ref 70–99)

## 2015-01-03 LAB — CBC
HCT: 35.8 % — ABNORMAL LOW (ref 39.0–52.0)
Hemoglobin: 10.5 g/dL — ABNORMAL LOW (ref 13.0–17.0)
MCH: 29 pg (ref 26.0–34.0)
MCHC: 29.3 g/dL — ABNORMAL LOW (ref 30.0–36.0)
MCV: 98.9 fL (ref 78.0–100.0)
Platelets: 168 10*3/uL (ref 150–400)
RBC: 3.62 MIL/uL — ABNORMAL LOW (ref 4.22–5.81)
RDW: 15.4 % (ref 11.5–15.5)
WBC: 11.3 10*3/uL — ABNORMAL HIGH (ref 4.0–10.5)

## 2015-01-03 LAB — BASIC METABOLIC PANEL
Anion gap: 3 — ABNORMAL LOW (ref 5–15)
BUN: 13 mg/dL (ref 6–23)
CO2: 41 mmol/L (ref 19–32)
Calcium: 8.1 mg/dL — ABNORMAL LOW (ref 8.4–10.5)
Chloride: 96 mmol/L (ref 96–112)
Creatinine, Ser: 0.39 mg/dL — ABNORMAL LOW (ref 0.50–1.35)
GFR calc Af Amer: >90 mL/min (ref >90)
GFR calc non Af Amer: >90 mL/min (ref >90)
Glucose, Bld: 110 mg/dL — ABNORMAL HIGH (ref 70–99)
Potassium: 4 mmol/L (ref 3.5–5.1)
Sodium: 140 mmol/L (ref 135–145)

## 2015-01-03 MED ORDER — OXYCODONE HCL 5 MG PO TABS
5.0000 mg | ORAL_TABLET | ORAL | Status: DC | PRN
  Administered 2015-01-05: 5 mg via ORAL
  Filled 2015-01-03: qty 1

## 2015-01-03 MED ORDER — CEFUROXIME AXETIL 500 MG PO TABS
500.0000 mg | ORAL_TABLET | Freq: Two times a day (BID) | ORAL | Status: DC
  Administered 2015-01-03 – 2015-01-07 (×9): 500 mg via ORAL
  Filled 2015-01-03 (×10): qty 1

## 2015-01-03 MED ORDER — DIGOXIN 0.25 MG/ML IJ SOLN
0.2500 mg | Freq: Every day | INTRAMUSCULAR | Status: AC
  Administered 2015-01-03: 0.25 mg via INTRAVENOUS
  Filled 2015-01-03 (×2): qty 1

## 2015-01-03 MED ORDER — SORBITOL 70 % SOLN
30.0000 mL | Freq: Once | Status: AC
  Administered 2015-01-03: 30 mL via ORAL
  Filled 2015-01-03: qty 30

## 2015-01-03 MED ORDER — DIGOXIN 125 MCG PO TABS
0.1250 mg | ORAL_TABLET | Freq: Every day | ORAL | Status: DC
  Administered 2015-01-04 – 2015-01-07 (×4): 0.125 mg via ORAL
  Filled 2015-01-03 (×4): qty 1

## 2015-01-03 MED ORDER — POTASSIUM CHLORIDE 10 MEQ/50ML IV SOLN
10.0000 meq | INTRAVENOUS | Status: AC
  Administered 2015-01-03 (×2): 10 meq via INTRAVENOUS
  Filled 2015-01-03 (×2): qty 50

## 2015-01-03 NOTE — Progress Notes (Signed)
PULMONARY / CRITICAL CARE MEDICINE    Name: Roger Mathews MRN: [id number] DOB: 1946-10-26    ADMISSION DATE:  12/18/2014  PRIMARY SERVICE: PCCM  CHIEF COMPLAINT:  SOB  BRIEF PATIENT DESCRIPTION:  69 y/o male smoker presented to [facility] hospital with dyspnea from Rt pneumothorax.  He has hx of GOLD D COPD with emphysema and was enrolled in hospice.  SIGNIFICANT EVENTS: 2/20  Transfer from [facility] to [facility] 2/23  Change to -20 suction on chest tube 2/25  Change to -10 suction on chest tube 2/27  CT to water seal 2/28  Small air leak on water seal, cxr with no definite PTX 2/29  Persistent air leak > back to suction 3/01  CT to water seal 3/4 VATS > bleb stapling, talc pleuridesis, extubated  STUDIES:  2/29 CT chest > R hydropneumothorax, moderate emphysema, dependent atelectasis, atherosclerosis or coronary arteries, aortic valve calcification, multiple renal lesions bilaterally, likely cysts, one 4.2cm lesion left kidney with calcification> would evaluate with MRI in 6 months  SUBJECTIVE:  Feeling better  VITAL SIGNS: Temp:  [97.4 F (36.3 C)-98.3 F (36.8 C)] 97.9 F (36.6 C) (03/07 0733) Pulse Rate:  [88-125] 91 (03/07 1000) Resp:  [13-27] 18 (03/07 1000) BP: (82-127)/(56-95) 90/59 mmHg (03/07 1000) SpO2:  [94 %-100 %] 99 % (03/07 1000) FiO2 (%):  [0 %] 0 % (03/06 1600) Weight:  [74.3 kg (163 lb 12.8 oz)] 74.3 kg (163 lb 12.8 oz) (03/07 0600)   INTAKE / OUTPUT: Intake/Output      03/06 0701 - 03/07 0700 03/07 0701 - 03/08 0700   P.O. 1640    I.V. (mL/kg) 720 (9.7)    IV Piggyback     Total Intake(mL/kg) 2360 (31.8)    Urine (mL/kg/hr) 2515 (1.4)    Chest Tube 250 (0.1) 40 (0.2)   Total Output 2765 40   Net -405 -40          PHYSICAL EXAMINATION: General: sitting up in chair  Neuro: sitting up in chair HEENT: NCAT, ETT Cardiovascular: RRR, no mgr Lungs: Diminished bilaterally, no wheezing Abdomen: soft, non tender Musculoskeletal: no edema or  cyanosis  LABS:  CBC  Recent Labs Lab 01/01/15 0324 01/02/15 0400 01/03/15 0345  WBC 19.0* 16.5* 11.3*  HGB 11.0* 10.5* 10.5*  HCT 35.9* 35.1* 35.8*  PLT 218 173 168   BMET  Recent Labs Lab 01/01/15 0324 01/02/15 0400 01/03/15 0345  NA 141 142 140  K 4.0 4.2 4.0  CL 100 100 96  CO2 36* 39* 41*  BUN CREATININE 0.49* 0.41* 0.39*  GLUCOSE 179* 122* 110*   Electrolytes  Recent Labs Lab 01/01/15 0324 01/02/15 0400 01/03/15 0345  CALCIUM 8.4 8.6 8.1*    Imaging Dg Chest Port 1 View  01/03/2015   CLINICAL DATA:  Video-assisted fluoroscopy and stapling of blebs. Evaluate for pneumothorax.  EXAM: PORTABLE CHEST - 1 VIEW  COMPARISON:  01/02/2015  FINDINGS: Surgical staples in the right chest and findings are compatible with recent surgery. Stable position of the right chest tube without a pneumothorax. Stable focal densities in the periphery of the right chest. Again noted is subcutaneous edema. Right jugular central line tip in the upper SVC region. Heart size is stable. Trachea is midline. Left lung is clear without focal airspace disease.  IMPRESSION: Stable position of the right chest tube without a large pneumothorax.  Stable parenchymal densities along the periphery of the right lung.   Electronically Signed   By: Madelaine Bhat  Lowella DandyHenn M.D.   On: 01/03/2015 07:53   Dg Chest Port 1 View  01/02/2015   CLINICAL DATA:  Right pneumothorax. Chest tube. COPD. Bronchopleural fistula.  EXAM: PORTABLE CHEST - 1 VIEW  COMPARISON:  01/01/2015  FINDINGS: Right chest tube and right jugular central venous catheter remain in place. No residual pneumothorax visualized. Pulmonary opacity in the peripheral right mid and lower lung fields is stable. Changes of COPD again noted. Heart size is within normal limits.  IMPRESSION: Stable airspace opacity in peripheral right lung field. No recurrent pneumothorax visualized.  COPD.   Electronically Signed   By: Myles RosenthalJohn  Stahl M.D.   On: 01/02/2015 07:26     ASSESSMENT / PLAN:  Spontaneous R Pneumothorax with Subcutaneous Emphysema > s/p VATS pleuridesis 3/4, still with persistent air leak 3/5 Severe, GOLD D COPD with emphysema Protein calorie malnutrition Hyperglycemia Physical Deconditioning Kidney cysts noted, radiology recommends MRI in 6 months Post op hypotension> suspect sedation related Hypotension post op> presumably sedation/pain med related, he is doing well so not in shock  Plan:   -Out of bed, incentive spirometry. -Ambulate as able. -Continue symbicort and albuterol -SCD for DVT prophylaxis -Regular diet. -Suction down to ten.  Further management per CVTS.  PCCM will sign off, please call back if needed.  Alyson ReedyWesam G. Willadeen Colantuono, M.D. [facility]. Pager: [phone]. After hours pager: [phone].

## 2015-01-03 NOTE — Progress Notes (Signed)
Attempted report x 2 

## 2015-01-03 NOTE — Progress Notes (Signed)
21cc Fentanyl from Fent PCA wasted with Herbert DeanerPuja Patel, RN. Rubye Strohmeyer L

## 2015-01-03 NOTE — Progress Notes (Addendum)
3 Days Post-Op Procedure(s) (LRB): VIDEO ASSISTED THORACOSCOPY (Right) STAPLING OF BLEBS (Right) Subjective: severwe COPD s/p VATS for spont PNTX with prolonged air leak pulm status stable on 4 L-- he is on home O2 CXR w/o pntx- will decrease suction to 10 cm Objective: Vital signs in last 24 hours: Temp:  [97.4 F (36.3 C)-98.3 F (36.8 C)] 97.9 F (36.6 C) (03/07 0733) Pulse Rate:  [84-105] 93 (03/07 0600) Cardiac Rhythm:  [-] Sinus tachycardia (03/07 0400) Resp:  [13-27] 27 (03/07 0700) BP: (82-127)/(56-95) 127/95 mmHg (03/07 0700) SpO2:  [94 %-100 %] 95 % (03/07 0700) FiO2 (%):  [0 %-35 %] 0 % (03/06 1600) Weight:  [163 lb 12.8 oz (74.3 kg)] 163 lb 12.8 oz (74.3 kg) (03/07 0600)  Hemodynamic parameters for last 24 hours:  stable  Intake/Output from previous day: 03/06 0701 - 03/07 0700 In: 2360 [P.O.:1640; I.V.:720] Out: 2765 [Urine:2515; Chest Tube:250] Intake/Output this shift:    Air leak present w/cough Scattered rhonchi  Lab Results:  Recent Labs  01/02/15 0400 01/03/15 0345  WBC 16.5* 11.3*  HGB 10.5* 10.5*  HCT 35.1* 35.8*  PLT 173 168   BMET:  Recent Labs  01/02/15 0400 01/03/15 0345  NA 142 140  K 4.2 4.0  CL 100 96  CO2 39* 41*  GLUCOSE 122* 110*  BUN 11 13  CREATININE 0.41* 0.39*  CALCIUM 8.6 8.1*    PT/INR: No results for input(s): LABPROT, INR in the last 72 hours. ABG    Component Value Date/Time   PHART 7.275* 01/01/2015 0325   HCO3 38.0* 01/01/2015 0325   TCO2 41 01/01/2015 0325   O2SAT 90.0 01/01/2015 0325   CBG (last 3)   Recent Labs  01/02/15 0743 01/02/15 1135 01/02/15 1547  GLUCAP 93 85 91    Assessment/Plan: S/P Procedure(s) (LRB): VIDEO ASSISTED THORACOSCOPY (Right) STAPLING OF BLEBS (Right) tx to step down Pt will need SNF Tracheal aspirate with gram + cocci- cont antibiotc  LOS: 16 days    VAN TRIGT III,Maymie Brunke 01/03/2015

## 2015-01-03 NOTE — Progress Notes (Signed)
Pt given wedding from closet. Pt concerned that he did not know where it was located. Wedding ring was labeled in denture cup in belongings bag. Pt wearing currently. Pt resting with call bell within reach.  Will continue to monitor. Thomas HoffBurton, Ruben Mahler McClintock, RN

## 2015-01-03 NOTE — Progress Notes (Addendum)
Report called to 2W RN-Sandra. Pt to transfer to 2W-24 via ambulation/ wheelchair. Meds in chart, belongings at bedside. Message left for wife to call RN back with transfer information. No current questions or complaints. Bedside handoff to 2W RN, Dois DavenportSandra.  Kenzi Bardwell L

## 2015-01-04 ENCOUNTER — Inpatient Hospital Stay (HOSPITAL_COMMUNITY): Payer: Medicare Other

## 2015-01-04 LAB — BASIC METABOLIC PANEL
Anion gap: 3 — ABNORMAL LOW (ref 5–15)
BUN: 10 mg/dL (ref 6–23)
CO2: 42 mmol/L (ref 19–32)
Calcium: 8.6 mg/dL (ref 8.4–10.5)
Chloride: 98 mmol/L (ref 96–112)
Creatinine, Ser: 0.48 mg/dL — ABNORMAL LOW (ref 0.50–1.35)
GFR calc Af Amer: >90 mL/min (ref >90)
GFR calc non Af Amer: >90 mL/min (ref >90)
Glucose, Bld: 94 mg/dL (ref 70–99)
Potassium: 4.6 mmol/L (ref 3.5–5.1)
Sodium: 143 mmol/L (ref 135–145)

## 2015-01-04 LAB — CBC
HCT: 33.7 % — ABNORMAL LOW (ref 39.0–52.0)
Hemoglobin: 10.2 g/dL — ABNORMAL LOW (ref 13.0–17.0)
MCH: 29.2 pg (ref 26.0–34.0)
MCHC: 30.3 g/dL (ref 30.0–36.0)
MCV: 96.6 fL (ref 78.0–100.0)
Platelets: 190 10*3/uL (ref 150–400)
RBC: 3.49 MIL/uL — ABNORMAL LOW (ref 4.22–5.81)
RDW: 15.4 % (ref 11.5–15.5)
WBC: 8.7 10*3/uL (ref 4.0–10.5)

## 2015-01-04 LAB — CULTURE, RESPIRATORY W GRAM STAIN

## 2015-01-04 LAB — GLUCOSE, CAPILLARY
GLUCOSE-CAPILLARY: 90 mg/dL (ref 70–99)
GLUCOSE-CAPILLARY: 93 mg/dL (ref 70–99)
GLUCOSE-CAPILLARY: 163 mg/dL — AB (ref 70–99)
Glucose-Capillary: 89 mg/dL (ref 70–99)

## 2015-01-04 NOTE — Progress Notes (Signed)
       [address]       [place] [id number]             [phone]          4 Days Post-Op Procedure(s) (LRB): VIDEO ASSISTED THORACOSCOPY (Right) STAPLING OF BLEBS (Right)  Subjective: Just back from x-ray. Feels well, no complaints.     Objective: Vital signs in last 24 hours: Patient Vitals for the past 24 hrs:  BP Temp Temp src Pulse Resp SpO2 Weight  01/04/15 0400 94/62 mmHg 98.6 F (37 C) Oral 92 (!) 21 98 % 169 lb 11.2 oz (76.975 kg)  01/03/15 2340 101/72 mmHg - - (!) 109 - - -  01/03/15 2041 (!) 95/52 mmHg 99.4 F (37.4 C) Oral (!) 107 (!) 22 95 % -  01/03/15 1933 - - - - - 97 % -  01/03/15 1419 110/72 mmHg 99.3 F (37.4 C) Oral (!) 108 - 98 % -  01/03/15 1400 117/78 mmHg - - - (!) 23 - -  01/03/15 1300 99/61 mmHg - - (!) 101 (!) 25 96 % -  01/03/15 1243 - 98 F (36.7 C) Oral - - - -  01/03/15 1200 95/60 mmHg - - 90 (!) 21 100 % -  01/03/15 1100 (!) 96/58 mmHg - - - (!) 22 - -  01/03/15 1000 (!) 90/59 mmHg - - 91 18 99 % -  01/03/15 0903 - - - - - 96 % -  01/03/15 0902 - - - - - 96 % -  01/03/15 0900 98/64 mmHg - - (!) 125 (!) 25 97 % -  01/03/15 0800 92/64 mmHg - - 94 16 100 % -   Current Weight  01/04/15 169 lb 11.2 oz (76.975 kg)     Intake/Output from previous day: 03/07 0701 - 03/08 0700 In: 1030 [P.O.:720; I.V.:210; IV Piggyback:100] Out: 3200 [Urine:2970; Chest Tube:230]    PHYSICAL EXAM:  Heart: RRR, mildly tachy around 100 Lungs: Diminished BS, no wheezes Wound: Clean and dry Chest tube: Small intermittent 1/7 leak off suction    Lab Results: CBC: Recent Labs  01/03/15 0345 01/04/15 0510  WBC 11.3* 8.7  HGB 10.5* 10.2*  HCT 35.8* 33.7*  PLT 168 190   BMET:  Recent Labs  01/03/15 0345 01/04/15 0510  NA 140 143  K 4.0 4.6  CL 96 98  CO2 41* 42*  GLUCOSE 110* 94  BUN 13 10  CREATININE 0.39* 0.48*  CALCIUM 8.1* 8.6    PT/INR: No results for input(s): LABPROT, INR in the last 72 hours.  CXR  stable, no obvious ptx with decreasing SQ air.   Assessment/Plan: S/P Procedure(s) (LRB): VIDEO ASSISTED THORACOSCOPY (Right) STAPLING OF BLEBS (Right) CXR stable, CT with intermittent small air leak.  Will place CT to water seal. COPD- continue pulm toilet, nebs, etc. Wean O2 as able (was on 3L at home, currently stable on 4-5 L). HTN- back on home meds, BPs low normal. Sputum + Strep pneumo. Leukocytosis improved.  Continue Ceftin. Endo- CBGs stable, no h/o DM (A1C=5.8), will d/c SSI. Ambulate in halls today.   LOS: 17 days    Darrel Baroni H 01/04/2015

## 2015-01-04 NOTE — Progress Notes (Signed)
NUTRITION FOLLOW UP  DOCUMENTATION CODES Per approved criteria  -Severe malnutrition in the context of chronic illness   INTERVENTION: Encourage adequate PO intake.   NUTRITION DIAGNOSIS: Malnutrition related to chronic illness as evidenced by severe fat and muscle depletion; ongoing  Goal: Pt to meet >/= 90% of their estimated nutrition needs; met  Monitor:  PO intake, weight trends, labs, I/O's  69 y.o. male  ASSESSMENT: Pt admitted to [facility] hospital and transferred to [facility] with large right pneumothorax. Pt with hx of end-stage/GOLD COPD with emphysema, continues to smoke, and enrolled in hospice.  Chest tube in place, changed to water seal 2/27, air leak noted 2/28, chest tube back to suction 2/29.   Procedure 3/5: VIDEO ASSISTED THORACOSCOPY (Right) STAPLING OF BLEBS (Right)  Pt reports having a good appetite. Meal completion has been 100%. Pt was offered nourishment snacks, however pt refused. Pt was encouraged to eat his food at meals.   Labs: Low creatinine. High CO2.  Height: Ht Readings from Last 1 Encounters:  12/19/14 5' 10"  (1.778 m)    Weight: Wt Readings from Last 1 Encounters:  01/04/15 169 lb 11.2 oz (76.975 kg)    BMI:  Body mass index is 24.35 kg/(m^2).  Re-Estimated Nutritional Needs: Kcal: 1800-2000 Protein: 90-105 grams Fluid: > 1.8 L/day  Skin: right chest tube, generalized edema  Diet Order: Diet Carb Modified Meal Completion: 100%   Intake/Output Summary (Last 24 hours) at 01/04/15 1351 Last data filed at 01/04/15 1300  Gross per 24 hour  Intake    750 ml  Output   3436 ml  Net  -2686 ml    Last BM: 3/7  Labs:   Recent Labs Lab 01/02/15 0400 01/03/15 0345 01/04/15 0510  NA 142 140 143  K 4.2 4.0 4.6  CL 100 96 98  CO2 39* 41* 42*  BUN 11 13 10   CREATININE 0.41* 0.39* 0.48*  CALCIUM 8.6 8.1* 8.6  GLUCOSE 122* 110* 94    CBG (last 3)   Recent Labs  01/04/15 0552 01/04/15 0611 01/04/15 1127  GLUCAP 89 90  93    Scheduled Meds: . acetaminophen  1,000 mg Oral 4 times per day   Or  . acetaminophen (TYLENOL) oral liquid 160 mg/5 mL  1,000 mg Oral 4 times per day  . antiseptic oral rinse  7 mL Mouth Rinse BID  . aspirin  81 mg Oral Daily  . bisacodyl  10 mg Oral Daily  . budesonide-formoterol  2 puff Inhalation BID  . busPIRone  10 mg Oral TID  . cefUROXime  500 mg Oral BID  . Chlorhexidine Gluconate Cloth  6 each Topical Daily  . digoxin  0.125 mg Oral Daily  . enoxaparin (LOVENOX) injection  40 mg Subcutaneous Daily  . metoprolol tartrate  12.5 mg Oral BID  . mirtazapine  7.5 mg Oral QHS  . multivitamin with minerals  1 tablet Oral Daily  . mupirocin ointment  1 application Nasal BID  . nicotine  14 mg Transdermal Daily  . pantoprazole  40 mg Oral Daily  . pravastatin  80 mg Oral Daily  . senna-docusate  1 tablet Oral QHS  . sertraline  100 mg Oral Daily  . tiotropium  18 mcg Inhalation Daily    Continuous Infusions:   Past Medical History  Diagnosis Date  . COPD (chronic obstructive pulmonary disease)   . Anxiety   . Depression   . HTN (hypertension)   . Atrial fibrillation  Past Surgical History  Procedure Laterality Date  . Tonsilectomy/adenoidectomy with myringotomy    . Video assisted thoracoscopy Right 12/31/2014    Procedure: VIDEO ASSISTED THORACOSCOPY;  Surgeon: Ivin Poot, MD;  Location: [place];  Service: Thoracic;  Laterality: Right;  . Stapling of blebs Right 12/31/2014    Procedure: STAPLING OF BLEBS;  Surgeon: Ivin Poot, MD;  Location: [place];  Service: Thoracic;  Laterality: Right;    Kallie Locks, MS, RD, LDN Pager # [phone] After hours/ weekend pager # [phone]

## 2015-01-04 NOTE — Progress Notes (Signed)
Patient encouraged to ambulate, declines at this time, patient has been up to chair most of the day. Will continue to monitor patient. Yaniah Thiemann, Randall AnKristin Jessup RN

## 2015-01-04 NOTE — Progress Notes (Addendum)
Nursing note Patient declining for IV to be restarted at this time will continue to monitor patient. Amor Packard, Randall AnKristin Jessup RN

## 2015-01-05 ENCOUNTER — Inpatient Hospital Stay (HOSPITAL_COMMUNITY): Payer: Medicare Other

## 2015-01-05 NOTE — Progress Notes (Addendum)
       [address]       [place] [id number]             [phone]          5 Days Post-Op Procedure(s) (LRB): VIDEO ASSISTED THORACOSCOPY (Right) STAPLING OF BLEBS (Right)  Subjective: Feels well, no complaints.  Breathing stable.  Did not walk much yesterday.   Objective: Vital signs in last 24 hours: Patient Vitals for the past 24 hrs:  BP Temp Temp src Pulse Resp SpO2 Weight  01/05/15 0516 110/68 mmHg - - 98 - - -  01/05/15 0450 (!) 89/60 mmHg 98 F (36.7 C) Oral 75 18 99 % 167 lb 4.8 oz (75.887 kg)  01/04/15 2225 103/68 mmHg - - 96 - - -  01/04/15 2117 97/68 mmHg 98.2 F (36.8 C) Oral 87 18 100 % -  01/04/15 1924 - - - - - 95 % -  01/04/15 1335 91/62 mmHg 98.5 F (36.9 C) Oral 89 18 96 % -  01/04/15 0953 104/79 mmHg - - (!) 125 - - -   Current Weight  01/05/15 167 lb 4.8 oz (75.887 kg)     Intake/Output from previous day: 03/08 0701 - 03/09 0700 In: 720 [P.O.:720] Out: 1686 [Urine:1575; Stool:1; Chest Tube:110]    PHYSICAL EXAM:  Heart: RRR Lungs: Exp wheezes bilaterally Wound: Clean and dry Chest tube: No air leak    Lab Results: CBC: Recent Labs  01/03/15 0345 01/04/15 0510  WBC 11.3* 8.7  HGB 10.5* 10.2*  HCT 35.8* 33.7*  PLT 168 190   BMET:  Recent Labs  01/03/15 0345 01/04/15 0510  NA 140 143  K 4.0 4.6  CL 96 98  CO2 41* 42*  GLUCOSE 110* 94  BUN 13 10  CREATININE 0.39* 0.48*  CALCIUM 8.1* 8.6    PT/INR: No results for input(s): LABPROT, INR in the last 72 hours.  CXR: FINDINGS: Right-sided chest tube remains in place. There has been near-total interval resolution of the small right basilar pneumothorax. There are a few small locules of residual subpulmonic air. Trace residual right pleural effusion. Persistent subcutaneous emphysema in the right lateral chest wall. Stable background of chronic parenchymal disease with pulmonary hyperexpansion, bronchitic change and interstitial prominence  consistent with underlying emphysema and probable chronic bronchitis. No acute osseous abnormality.  IMPRESSION: 1. Near-total interval resolution of right basilar hydropneumothorax. 2. Chest tube remains in good position. 3. Similar appearance of chronic lung disease.   Assessment/Plan: S/P Procedure(s) (LRB): VIDEO ASSISTED THORACOSCOPY (Right) STAPLING OF BLEBS (Right) CT with no air leak noted today, CXR with resolution of ptx.  Hopefully can d/c CT today. COPD- Continue home meds, O2. HTN- BPs stable. Strep pneumo in sputum- Ceftin D#3. WBC trending down, less cough. Possibly home in next day or 2 if stable after CT removal. Pt has had HH, Home Hospice in the past- will resume.   LOS: 18 days    COLLINS,GINA H 01/05/2015  Airleak appears to have been resolved. We'll plan tube for 30 minutes to determine if any air bubbles come through. If not we'll DC tube. PMI chest x-ray in a.m. Surgical incision is healing well.  patient examined and medical record reviewed,agree with above note. Kathlee Nationseter Van Trigt III 01/05/2015

## 2015-01-05 NOTE — Progress Notes (Signed)
Patient ambulated 150 feet with RW on 4 L O2. Patient tolerated well. Patient returned to bed with call bell within reach.

## 2015-01-05 NOTE — Progress Notes (Signed)
Removed CT on right side per MD order per hospital policy. Patient tolerated well. Applied vaseline and gauze to site with hyperfix tape. Will continue to monitor closely. Lajuana Matteina Monti Villers, RN

## 2015-01-05 NOTE — Discharge Summary (Signed)
[address]       Roger Mathews [address]             [phone]              Discharge Summary  Name: Roger Mathews DOB: 03-12-1946 69 y.o. MRN: [id number]   Admission Date: 12/18/2014 Discharge Date: 01/07/2015    Admitting Diagnosis: Right spontaneous pneumothorax   Discharge Diagnosis:  Right spontaneous pneumothorax Persistent air leak Right lung blebs  Past Medical History  Diagnosis Date  . COPD (chronic obstructive pulmonary disease)   . Anxiety   . Depression   . HTN (hypertension)   . Atrial fibrillation      Procedures: RIGHT VIDEO ASSISTED THORACOSCOPY STAPLING OF BLEBS  - 12/31/2014   HPI:  The patient is a 69 y.o. male with severe end-stage COPD on home oxygen and with home hospice.  He presented to the ER at a hospital in [facility] complaining of acute onset dyspnea and was found to have a large right spontaneous pneumothorax.  A chest tube was placed and the patient was transferred to [facility] for pulmonary management.    Hospital Course:  The patient was admitted to [facility] on 12/18/2014. Over the course of the next week or so, the chest tube was managed with varying degrees of suction without resolution of the pneumothorax, and a large air leak persisted. A CT of the chest was performed which revealed multiple blebs.  A thoracic surgery consultation was requested for consideration of VATS.  Dr. Donata Clay saw the patient and reviewed his films.  It was felt that surgery would be necessary for definite management and recommended a right VATS. All risks, benefits and alternatives of surgery were explained in detail, and the patient agreed to proceed.   The patient was taken to the operating room and underwent the above procedure.    The postoperative course was notable for a productive cough with purulent sputum production and leukocytosis.  The patient was started on empiric antibiotics.  Sputum culture was positive for  Strep pneumoniae. White count has trended down and the patient has symptomatically improved.  The chest tube initially had a small air leak and was managed conservatively.  It was gradually weaned from suction and ultimately was discontinued on 01/05/2015.  Follow up chest x-ray has remained stable with no pneumothorax.  The patient has otherwise progressed well.  He is ambulating short distances with assistance and is tolerating a regular diet.  His oxygen saturations are stable on 3 liters, which is what he uses at home. Pulmonary critical care has assisted postoperatively with management of his COPD medications and he is presently at his pulmonary baseline.  The patient has been evaluated on today's date and is medically stable for discharge home.  He will continue on home oxygen and home hospice will be reinstated as before.     Recent vital signs:  Filed Vitals:   01/07/15 0452  BP: 101/66  Pulse: 99  Temp: 98 F (36.7 C)  Resp: 18    Recent laboratory studies:  CBC:No results for input(s): WBC, HGB, HCT, PLT in the last 72 hours. BMET: No results for input(s): NA, K, CL, CO2, GLUCOSE, BUN, CREATININE, CALCIUM in the last 72 hours.  PT/INR: No results for input(s): LABPROT, INR in the last 72 hours.   Discharge Medications:     Medication List    TAKE these medications  albuterol (2.5 MG/3ML) 0.083% nebulizer solution  Commonly known as:  PROVENTIL  Take 2.5 mg by nebulization every 6 (six) hours as needed for wheezing or shortness of breath.     aspirin 81 MG tablet  Take 81 mg by mouth daily.     budesonide-formoterol 160-4.5 MCG/ACT inhaler  Commonly known as:  SYMBICORT  Inhale 2 puffs into the lungs 2 (two) times daily.     busPIRone 10 MG tablet  Commonly known as:  BUSPAR  Take 10 mg by mouth 3 (three) times daily.     cefUROXime 500 MG tablet  Commonly known as:  CEFTIN  Take 1 tablet (500 mg total) by mouth 2 (two) times daily. X 2 more days      digoxin 0.125 MG tablet  Commonly known as:  LANOXIN  Take 1 tablet (0.125 mg total) by mouth daily.     metoprolol tartrate 25 MG tablet  Commonly known as:  LOPRESSOR  Take 12.5 mg by mouth 2 (two) times daily.     mirtazapine 7.5 MG tablet  Commonly known as:  REMERON  Take 7.5 mg by mouth at bedtime.     multivitamin with minerals tablet  Take 1 tablet by mouth daily.     nicotine 14 mg/24hr patch  Commonly known as:  NICODERM CQ - dosed in mg/24 hours  Place 1 patch (14 mg total) onto the skin daily.     oxyCODONE 5 MG immediate release tablet  Commonly known as:  Oxy IR/ROXICODONE  Take 1 tablet (5 mg total) by mouth every 3 (three) hours as needed for severe pain.     pravastatin 80 MG tablet  Commonly known as:  PRAVACHOL  Take 80 mg by mouth daily.     sertraline 100 MG tablet  Commonly known as:  ZOLOFT  Take 150 mg by mouth daily.     tiotropium 18 MCG inhalation capsule  Commonly known as:  SPIRIVA  Place 18 mcg into inhaler and inhale daily.         Discharge Instructions:  The patient is to refrain from driving, heavy lifting or strenuous activity.  May shower daily and clean incisions with soap and water.  May resume regular diet.   Follow Up: Follow-up Information    Follow up with Mikey BussingPeter Van Trigt III, MD On 01/19/2015.   Specialty:  Cardiothoracic Surgery   Why:  [facility] at 8:30, then see MD at 9:30    Contact information:   [address] [place] [address] [phone]          Roger Mathews 01/07/2015, 8:15 AM

## 2015-01-06 ENCOUNTER — Inpatient Hospital Stay (HOSPITAL_COMMUNITY): Payer: Medicare Other

## 2015-01-06 NOTE — Progress Notes (Signed)
6 Days Post-Op Procedure(s) (LRB): VIDEO ASSISTED THORACOSCOPY (Right) STAPLING OF BLEBS (Right) Subjective: Chest tube out CXR w/o PNTXSOB but about baseline Will move to telemetry bed and plan DC inf AM CXR ok  Objective: Vital signs in last 24 hours: Temp:  [98.5 F (36.9 C)-99 F (37.2 C)] 98.7 F (37.1 C) (03/10 0500) Pulse Rate:  [86-120] 86 (03/10 0500) Cardiac Rhythm:  [-] Normal sinus rhythm (03/10 0735) Resp:  [17-20] 17 (03/10 0500) BP: (104-112)/(62-72) 108/62 mmHg (03/10 0500) SpO2:  [96 %-100 %] 100 % (03/10 0500) Weight:  [164 lb 10.9 oz (74.7 kg)] 164 lb 10.9 oz (74.7 kg) (03/10 0432)  Hemodynamic parameters for last 24 hours:   stable Intake/Output from previous day: 03/09 0701 - 03/10 0700 In: 840 [P.O.:840] Out: 1126 [Urine:1125; Stool:1] Intake/Output this shift:    Incisions clean and dry Scattered wheezes  Lab Results:  Recent Labs  01/04/15 0510  WBC 8.7  HGB 10.2*  HCT 33.7*  PLT 190   BMET:  Recent Labs  01/04/15 0510  NA 143  K 4.6  CL 98  CO2 42*  GLUCOSE 94  BUN 10  CREATININE 0.48*  CALCIUM 8.6    PT/INR: No results for input(s): LABPROT, INR in the last 72 hours. ABG    Component Value Date/Time   PHART 7.275* 01/01/2015 0325   HCO3 38.0* 01/01/2015 0325   TCO2 41 01/01/2015 0325   O2SAT 90.0 01/01/2015 0325   CBG (last 3)   Recent Labs  01/04/15 0611 01/04/15 1127 01/04/15 1728  GLUCAP 90 93 163*    Assessment/Plan: S/P Procedure(s) (LRB): VIDEO ASSISTED THORACOSCOPY (Right) STAPLING OF BLEBS (Right) Mobilize Follow pulmonary status Poss DC in am  LOS: 19 days    Kathlee Nationseter Van Trigt III 01/06/2015

## 2015-01-06 NOTE — Progress Notes (Signed)
       [address]       [place] [id number]             [phone]          6 Days Post-Op Procedure(s) (LRB): VIDEO ASSISTED THORACOSCOPY (Right) STAPLING OF BLEBS (Right)  Subjective: Feels well this am, no complaints.     Objective: Vital signs in last 24 hours: Patient Vitals for the past 24 hrs:  BP Temp Temp src Pulse Resp SpO2 Weight  01/06/15 0500 108/62 mmHg 98.7 F (37.1 C) Oral 86 17 100 % -  01/06/15 0432 - - - - - - 164 lb 10.9 oz (74.7 kg)  01/05/15 2134 104/65 mmHg 99 F (37.2 C) Oral (!) 101 20 96 % -  01/05/15 1334 112/72 mmHg 98.5 F (36.9 C) Oral 96 18 100 % -  01/05/15 1203 - - - (!) 120 - - -   Current Weight  01/06/15 164 lb 10.9 oz (74.7 kg)     Intake/Output from previous day: 03/09 0701 - 03/10 0700 In: 840 [P.O.:840] Out: 1126 [Urine:1125; Stool:1]    PHYSICAL EXAM:  Heart: RRR, mildly tachy around 100 Lungs: Scattered wheezes bilaterally Wound: Clean and dry     Lab Results: CBC: Recent Labs  01/04/15 0510  WBC 8.7  HGB 10.2*  HCT 33.7*  PLT 190   BMET:  Recent Labs  01/04/15 0510  NA 143  K 4.6  CL 98  CO2 42*  GLUCOSE 94  BUN 10  CREATININE 0.48*  CALCIUM 8.6    PT/INR: No results for input(s): LABPROT, INR in the last 72 hours.    Assessment/Plan: S/P Procedure(s) (LRB): VIDEO ASSISTED THORACOSCOPY (Right) STAPLING OF BLEBS (Right) CXR stable following CT removal.   COPD- continue home meds, O2. Strep pneumo in sputum- Ceftin D#4.  Pt states he will not have a way home today, so will plan for d/c home in am if arrangements can be made.   LOS: 19 days    Aadvika Konen H 01/06/2015

## 2015-01-06 NOTE — Progress Notes (Signed)
01/05/2015 08:35 PM The patient ambulated about 150 feet with front wheel walker and nurse. Ambulated with oxygen. Did fair but became very short of breath after he walked down the hallway and had to be taken be to room in a wheelchair.  Will continue to observe patient. Harriet Massonavidson, Trinisha Paget E

## 2015-01-07 ENCOUNTER — Inpatient Hospital Stay (HOSPITAL_COMMUNITY): Payer: Medicare Other

## 2015-01-07 MED ORDER — CEFUROXIME AXETIL 500 MG PO TABS: 500.0000 mg | ORAL_TABLET | Freq: Two times a day (BID) | ORAL | Status: DC

## 2015-01-07 MED ORDER — DIGOXIN 125 MCG PO TABS: 0.1250 mg | ORAL_TABLET | Freq: Every day | ORAL | Status: AC

## 2015-01-07 MED ORDER — OXYCODONE HCL 5 MG PO TABS: 5.0000 mg | ORAL_TABLET | ORAL | Status: DC | PRN

## 2015-01-07 MED ORDER — NICOTINE 14 MG/24HR TD PT24: 14.0000 mg | MEDICATED_PATCH | Freq: Every day | TRANSDERMAL | Status: DC

## 2015-01-07 NOTE — Progress Notes (Addendum)
       [address]       [place] [id number]             [phone]          7 Days Post-Op Procedure(s) (LRB): VIDEO ASSISTED THORACOSCOPY (Right) STAPLING OF BLEBS (Right)  Subjective: Feels well, no complaints.   Objective: Vital signs in last 24 hours: Patient Vitals for the past 24 hrs:  BP Temp Temp src Pulse Resp SpO2 Weight  01/07/15 0452 101/66 mmHg 98 F (36.7 C) Oral 99 18 94 % 163 lb 2.3 oz (74 kg)  01/06/15 2002 110/77 mmHg 98 F (36.7 C) Oral 84 18 97 % -  01/06/15 1403 100/64 mmHg 98 F (36.7 C) Oral 79 18 99 % -  01/06/15 1124 - - - - - 96 % -   Current Weight  01/07/15 163 lb 2.3 oz (74 kg)     Intake/Output from previous day: 03/10 0701 - 03/11 0700 In: 1090 [P.O.:1090] Out: 1000 [Urine:1000]    PHYSICAL EXAM:  Heart: RRR Lungs: Few exp wheezes Wound: Clean and dry     Lab Results: CBC:No results for input(s): WBC, HGB, HCT, PLT in the last 72 hours. BMET: No results for input(s): NA, K, CL, CO2, GLUCOSE, BUN, CREATININE, CALCIUM in the last 72 hours.  PT/INR: No results for input(s): LABPROT, INR in the last 72 hours.    Assessment/Plan: S/P Procedure(s) (LRB): VIDEO ASSISTED THORACOSCOPY (Right) STAPLING OF BLEBS (Right) CXR stable.  Doing well. Plan home today with Home Hospice, O2, etc.  Instructions reviewed with patient.   LOS: 20 days    COLLINS,GINA H 01/07/2015  cxr looks fine respiratory status stable Agree with DC plans today Leave CT suture in and skin sutures at ol chest tube site  patient examined and medical record reviewed,agree with above note. Kathlee Nationseter Van Trigt III 01/07/2015

## 2015-01-07 NOTE — Progress Notes (Signed)
Assessment unchanged. Discussed D/C instructions with pt and wife including f/u appointments and new medications. Verbalized understanding. RX given to pt. IV and tele removed. Pt left with belongings accompanied by NT.

## 2015-01-10 DIAGNOSIS — Z515 Encounter for palliative care: Secondary | ICD-10-CM | POA: Diagnosis not present

## 2015-01-10 NOTE — Anesthesia Postprocedure Evaluation (Signed)
  Anesthesia Post-op Note  Patient: Roger Mathews  Procedure(s) Performed: Procedure(s): VIDEO ASSISTED THORACOSCOPY (Right) STAPLING OF BLEBS (Right)  Patient Location: ICU  Anesthesia Type:General  Level of Consciousness: Patient remains intubated per anesthesia plan  Airway and Oxygen Therapy: Patient remains intubated per anesthesia plan  Post-op Pain: none  Post-op Assessment: Post-op Vital signs reviewed, Patient's Cardiovascular Status Stable and Respiratory Function Stable  Post-op Vital Signs: Reviewed and stable  Last Vitals:  Filed Vitals:   01/07/15 0953  BP: 97/62  Pulse: 94  Temp:   Resp:     Complications: No apparent anesthesia complications

## 2015-01-17 ENCOUNTER — Other Ambulatory Visit: Payer: Self-pay | Admitting: Cardiothoracic Surgery

## 2015-01-17 DIAGNOSIS — R918 Other nonspecific abnormal finding of lung field: Secondary | ICD-10-CM

## 2015-01-19 ENCOUNTER — Encounter: Payer: Self-pay | Admitting: Cardiothoracic Surgery

## 2015-01-19 ENCOUNTER — Ambulatory Visit (INDEPENDENT_AMBULATORY_CARE_PROVIDER_SITE_OTHER): Admitting: Cardiothoracic Surgery

## 2015-01-19 ENCOUNTER — Ambulatory Visit: Payer: Medicare Other | Admitting: Cardiothoracic Surgery

## 2015-01-19 ENCOUNTER — Ambulatory Visit (HOSPITAL_COMMUNITY)
Admission: RE | Admit: 2015-01-19 | Discharge: 2015-01-19 | Disposition: A | Discharge status: Home / Self Care | Payer: Medicare Other | Source: Ambulatory Visit | Attending: Cardiothoracic Surgery | Admitting: Cardiothoracic Surgery

## 2015-01-19 VITALS — BP 103/69 | HR 100 | Resp 18 | Ht 70.0 in | Wt 167.0 lb

## 2015-01-19 DIAGNOSIS — J439 Emphysema, unspecified: Secondary | ICD-10-CM

## 2015-01-19 DIAGNOSIS — J9382 Other air leak: Secondary | ICD-10-CM

## 2015-01-19 DIAGNOSIS — R918 Other nonspecific abnormal finding of lung field: Secondary | ICD-10-CM

## 2015-01-19 DIAGNOSIS — J449 Chronic obstructive pulmonary disease, unspecified: Secondary | ICD-10-CM

## 2015-01-19 DIAGNOSIS — J939 Pneumothorax, unspecified: Secondary | ICD-10-CM

## 2015-01-19 DIAGNOSIS — IMO0002 Reserved for concepts with insufficient information to code with codable children: Secondary | ICD-10-CM

## 2015-01-19 NOTE — Progress Notes (Signed)
PCP is PROVIDER NOT IN SYSTEM Referring Provider is Lupita LeashMcQuaid, Douglas B, MD  Chief Complaint  Patient presents with  . Routine Post Op    s/p R VATS BLEB RESECTION 12/31/14 with a cxr    WUJ:WJXBJYHPI:postop followup after right VATS and resection of blebs and pleurodesis for spontaneous pneumothorax with prolonged large airleak. Patient doing well back in person [place] on home oxygen. The incisions have healed. Chest x-ray today shows no recurrent pneumothorax. He is not smoking. He denies any significant postthoracotomy pain syndrome.   Past Medical History  Diagnosis Date  . COPD (chronic obstructive pulmonary disease)   . Anxiety   . Depression   . HTN (hypertension)   . Atrial fibrillation     Past Surgical History  Procedure Laterality Date  . Tonsilectomy/adenoidectomy with myringotomy    . Video assisted thoracoscopy Right 12/31/2014    Procedure: VIDEO ASSISTED THORACOSCOPY;  Surgeon: Kerin PernaPeter Van Trigt, MD;  [facility];  Service: Thoracic;  Laterality: Right;  . Stapling of blebs Right 12/31/2014    Procedure: STAPLING OF BLEBS;  Surgeon: Kerin PernaPeter Van Trigt, MD;  [facility];  Service: Thoracic;  Laterality: Right;    No family history on file.  Social History History  Substance Use Topics  . Smoking status: Current Every Day Smoker -- 2.00 packs/day for 52 years    Types: Cigarettes    Start date: 12/19/1961  . Smokeless tobacco: Not on file  . Alcohol Use: Not on file    Current Outpatient Prescriptions  Medication Sig Dispense Refill  . albuterol (PROVENTIL) (2.5 MG/3ML) 0.083% nebulizer solution Take 2.5 mg by nebulization every 6 (six) hours as needed for wheezing or shortness of breath.    Marland Kitchen. aspirin 81 MG tablet Take 81 mg by mouth daily.    . budesonide-formoterol (SYMBICORT) 160-4.5 MCG/ACT inhaler Inhale 2 puffs into the lungs 2 (two) times daily.    . busPIRone (BUSPAR) 10 MG tablet Take 10 mg by mouth 3 (three) times daily.    . digoxin (LANOXIN) 0.125 MG tablet  Take 1 tablet (0.125 mg total) by mouth daily. 30 tablet 1  . metoprolol tartrate (LOPRESSOR) 25 MG tablet Take 12.5 mg by mouth 2 (two) times daily.    . mirtazapine (REMERON) 7.5 MG tablet Take 7.5 mg by mouth at bedtime.    . Multiple Vitamins-Minerals (MULTIVITAMIN WITH MINERALS) tablet Take 1 tablet by mouth daily.    . pravastatin (PRAVACHOL) 80 MG tablet Take 80 mg by mouth daily.    . sertraline (ZOLOFT) 100 MG tablet Take 150 mg by mouth daily.     Marland Kitchen. tiotropium (SPIRIVA) 18 MCG inhalation capsule Place 18 mcg into inhaler and inhale daily.     No current facility-administered medications for this visit.    Allergies  Allergen Reactions  . Penicillins Other (See Comments)    Unknown childhood allergic reaction    Review of Systems   Patient remains with minimal activity and is currently in a wheelchair No productive cough fever or evidence of pneumonia  BP 103/69 mmHg  Pulse 100  Resp 18  Ht 5\' 10"  (1.778 m)  Wt 167 lb (75.751 kg)  BMI 23.96 kg/m2  SpO2 98% Physical Exam Alert and comfortable Scattered rhonchi but breath sounds equal bilaterally VATS incision is well-healed Heart rhythm regular  Diagnostic Tests: Chest x-ray with changes COPD, no recurrent pneumothorax or pleural effusion postop VATS  Impression: Successful resection of blebs for treatment of a prolonged air leak causing  spontaneous pneumothorax.  Plan:total smoking cessation recommended probably the patient. He'll continue his current pulmonary meds and return here as needed.   Mikey Bussing, MD [facility] [phone]

## 2015-01-21 ENCOUNTER — Other Ambulatory Visit: Payer: Self-pay | Admitting: Thoracic Surgery (Cardiothoracic Vascular Surgery)

## 2015-01-21 ENCOUNTER — Telehealth: Payer: Self-pay | Admitting: Thoracic Surgery (Cardiothoracic Vascular Surgery)

## 2015-01-21 NOTE — Telephone Encounter (Signed)
Called requesting pain meds Has been taking oxycodone Explained I can not call that in, I called in a prescription for tramadol 50-100 mg PO TID PRN pain, #40, no refills

## 2015-03-29 ENCOUNTER — Other Ambulatory Visit: Payer: Self-pay | Admitting: Physician Assistant

## 2015-11-30 DEATH — deceased

## 2016-11-22 IMAGING — CR DG CHEST 1V PORT
1 series · 1 of 1 positions shown · non-contrast
Comparison: 12/19/2014

CLINICAL DATA: Pneumothorax.  Evaluate chest tube.

EXAM:
PORTABLE CHEST - 1 VIEW

[AP]
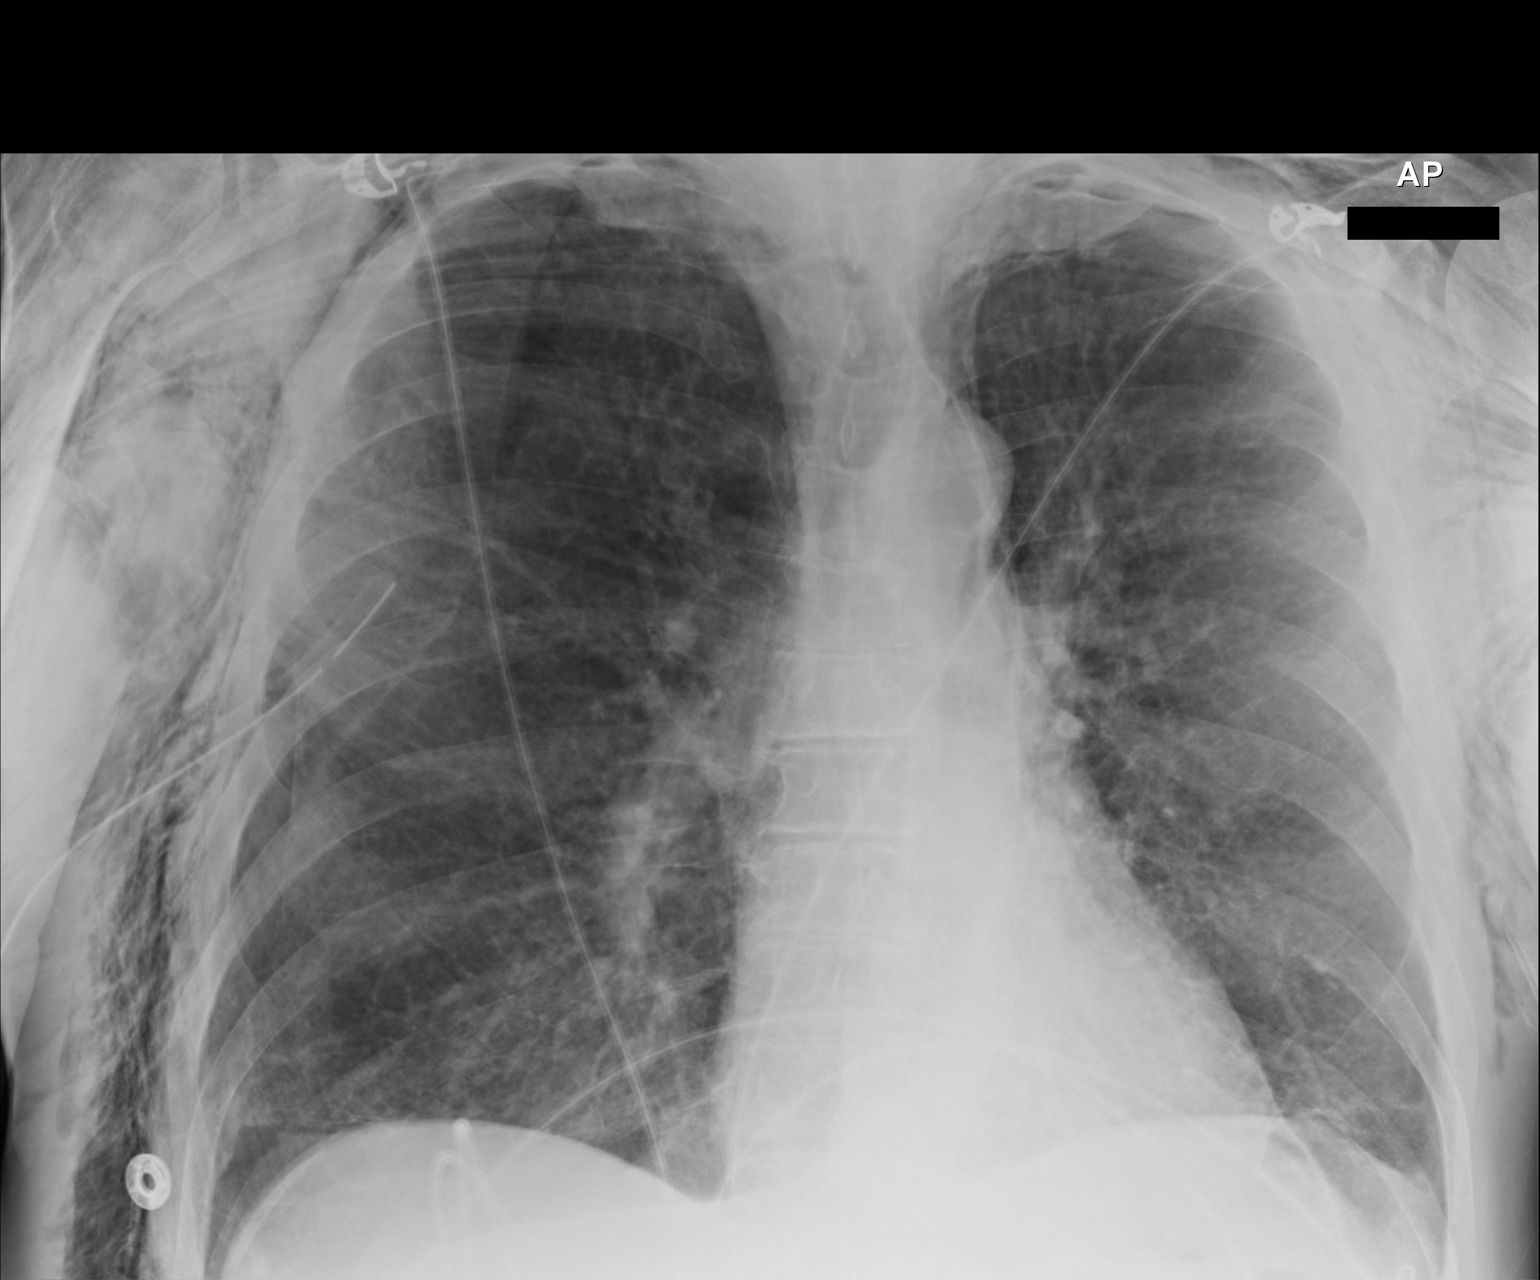

[1 of 1 positions shown; findings below may reference images not displayed]

FINDINGS: There is now extensive bilateral subcutaneous emphysema. There is a
right chest tube along the lateral aspect of the right hemithorax.
There appears to be a lateral and basilar right pneumothorax. The
pneumothorax may be slightly larger from the previous examination.
Again noted is hyperexpansion of the right hemithorax compared to
the left side. Atherosclerotic disease at the aortic arch. Heart
size is normal. Trachea remains midline.
IMPRESSION: Concern for an enlarging right pneumothorax despite stable position
of the right chest tube.

Increased subcutaneous gas.

These results were called by telephone at the time of interpretation
on 12/20/2014 at [DATE] to the patient's nurse, Lozko Macovei, who
verbally acknowledged these results.

## 2016-12-09 IMAGING — DX DG CHEST 2V
2 series · 2 of 2 positions shown · non-contrast
Comparison: 01/05/2015

CLINICAL DATA: Short of breath.  Cough.

EXAM:
CHEST  2 VIEW

[chest pa]
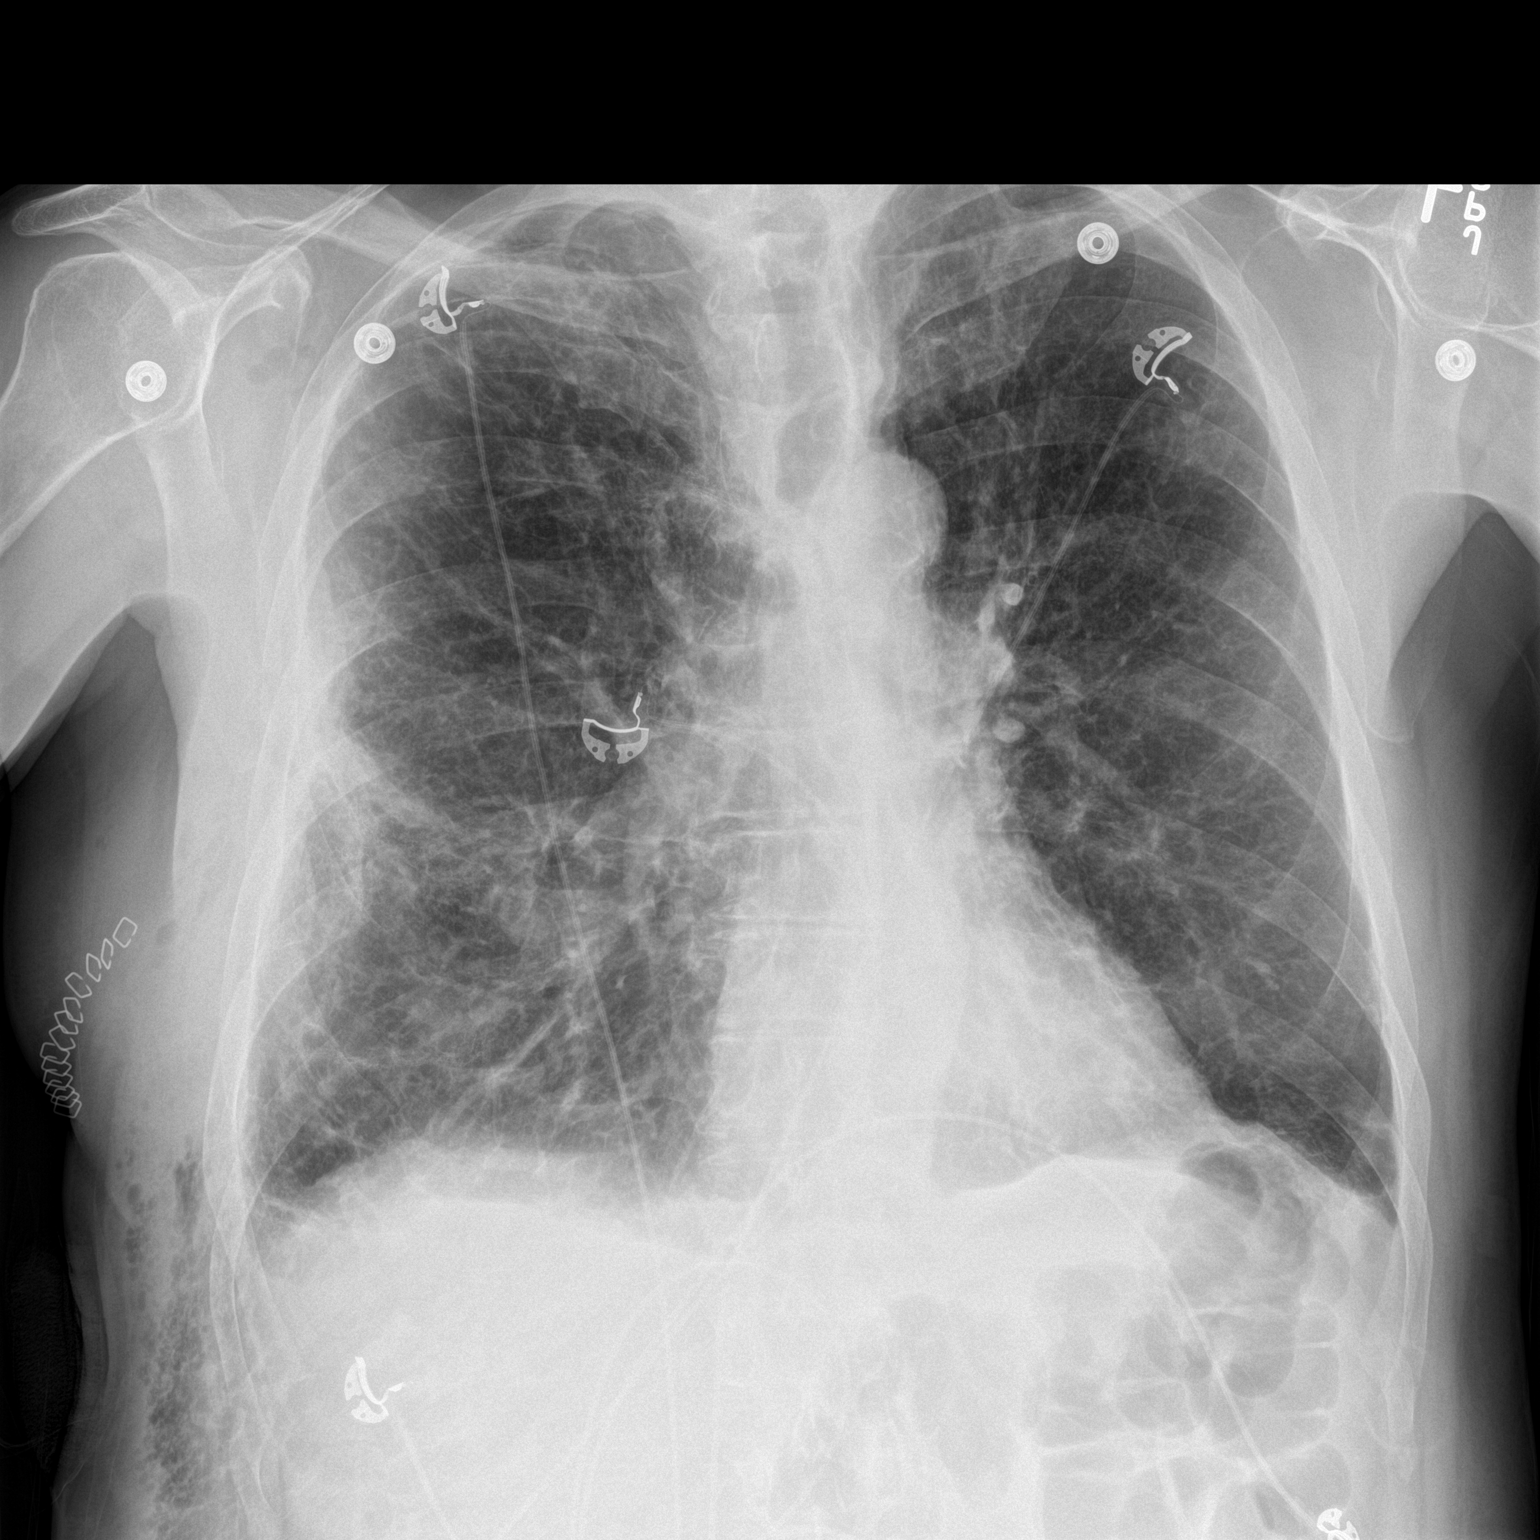

[chest lat]
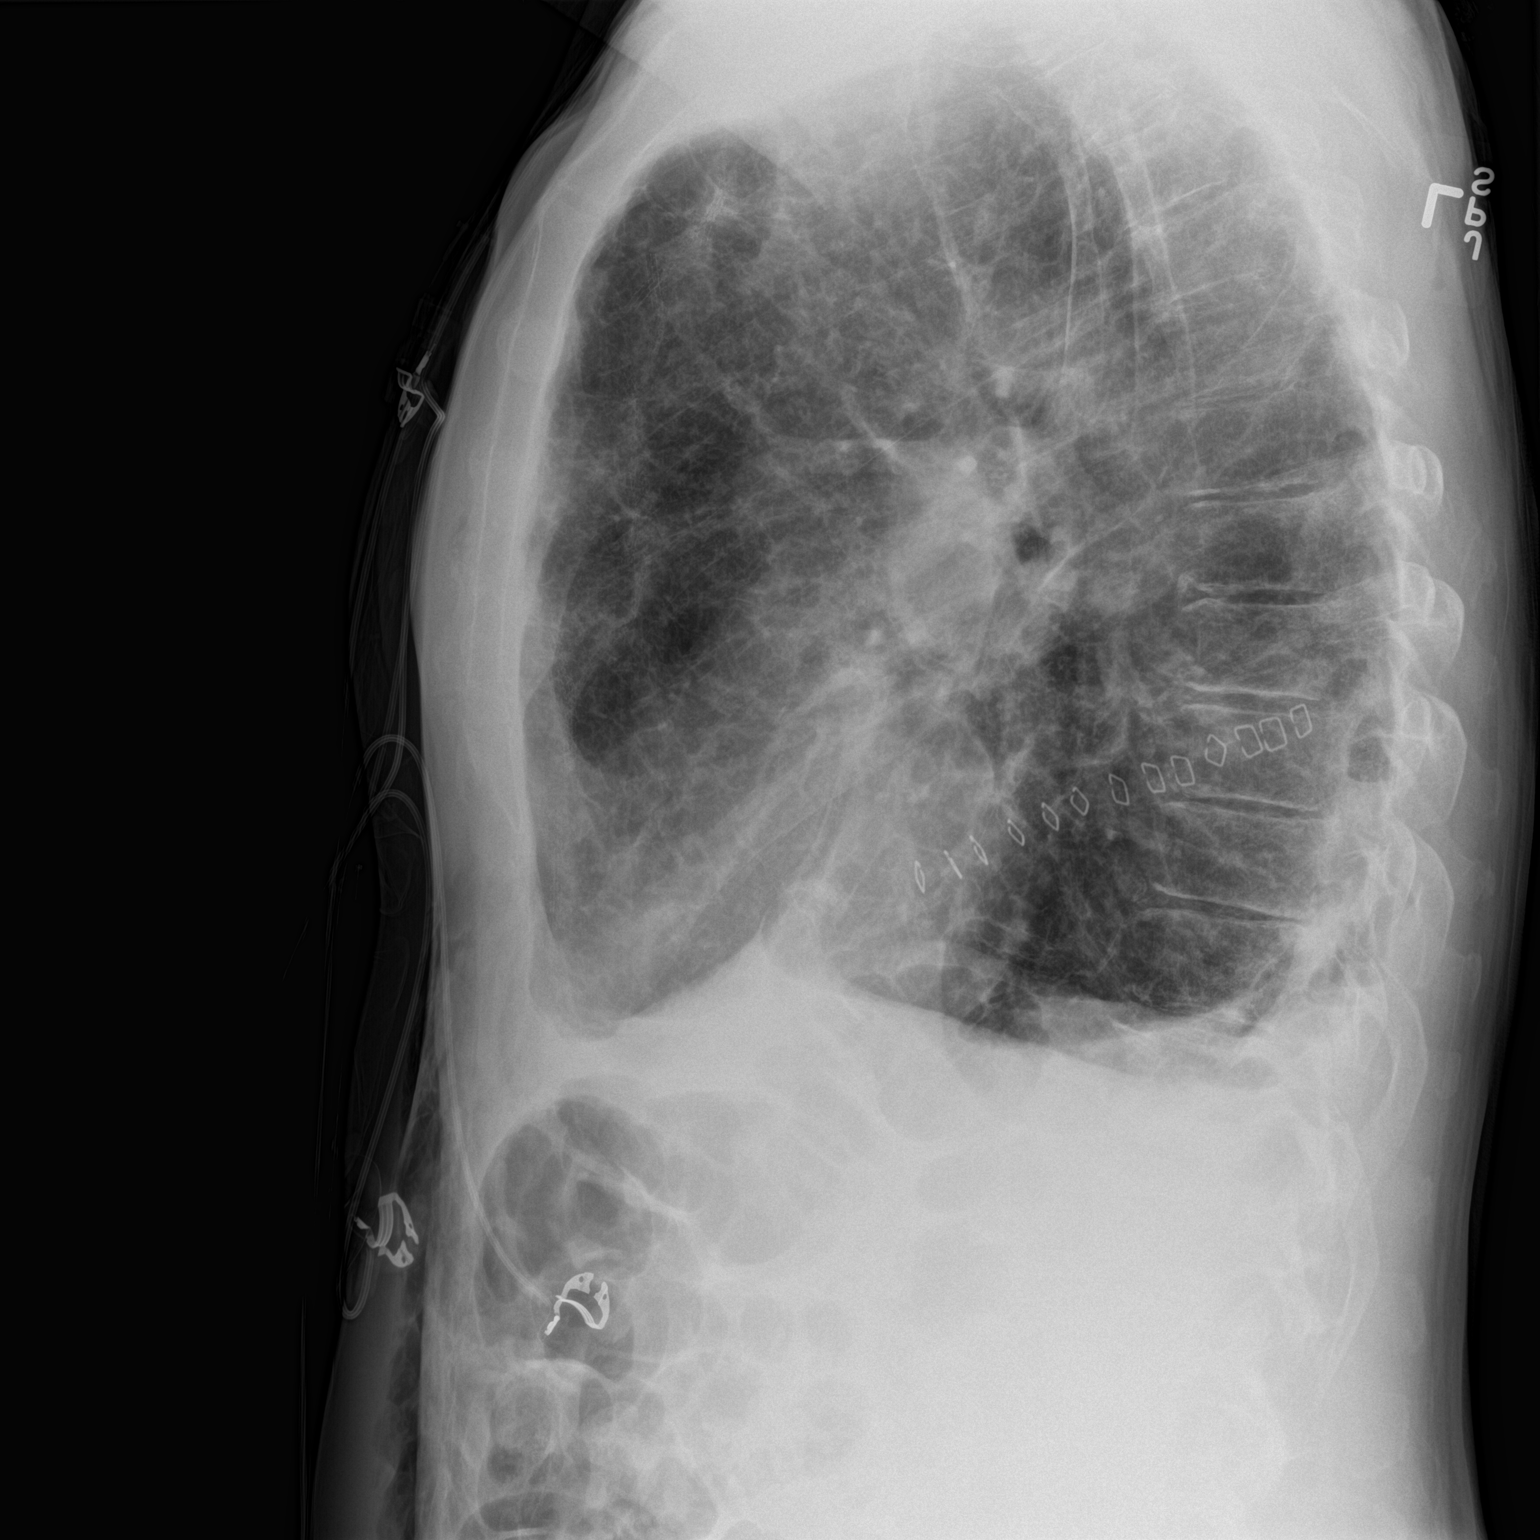

[2 of 2 positions shown; findings below may reference images not displayed]

FINDINGS: Heart size appears normal. There has been interval removal of the
right chest tube. No significant pneumothorax identified. Chronic
lung disease is again identified bilaterally and appears unchanged
from previous exam. No new findings identified
IMPRESSION: 1. No complications after removal of right chest tube.
